# Patient Record
Sex: Female | Born: 1980 | Race: White | Hispanic: No | Marital: Married | Smoking: Never smoker
Health system: Southern US, Community
[De-identification: ages and names within clinical notes are randomized; demographics above are authoritative.]

## PROBLEM LIST (undated history)

## (undated) DIAGNOSIS — I10 Essential (primary) hypertension: Secondary | ICD-10-CM

## (undated) DIAGNOSIS — E039 Hypothyroidism, unspecified: Secondary | ICD-10-CM

## (undated) DIAGNOSIS — K589 Irritable bowel syndrome without diarrhea: Secondary | ICD-10-CM

## (undated) DIAGNOSIS — E063 Autoimmune thyroiditis: Secondary | ICD-10-CM

## (undated) HISTORY — DX: Autoimmune thyroiditis: E06.3

## (undated) HISTORY — DX: Irritable bowel syndrome, unspecified: K58.9

## (undated) HISTORY — DX: Essential (primary) hypertension: I10

## (undated) HISTORY — DX: Hypothyroidism, unspecified: E03.9

---

## 2005-12-25 ENCOUNTER — Ambulatory Visit: Payer: Self-pay | Admitting: Family Medicine

## 2005-12-25 ENCOUNTER — Encounter: Payer: Self-pay | Admitting: Family Medicine

## 2005-12-25 ENCOUNTER — Other Ambulatory Visit: Admission: RE | Admit: 2005-12-25 | Discharge: 2005-12-25 | Payer: Self-pay | Admitting: Family Medicine

## 2005-12-25 LAB — CONVERTED CEMR LAB: Pap Smear: NORMAL

## 2006-09-01 ENCOUNTER — Encounter: Payer: Self-pay | Admitting: Family Medicine

## 2006-09-02 ENCOUNTER — Ambulatory Visit: Payer: Self-pay | Admitting: Family Medicine

## 2006-09-02 DIAGNOSIS — R519 Headache, unspecified: Secondary | ICD-10-CM | POA: Insufficient documentation

## 2006-09-02 DIAGNOSIS — R51 Headache: Secondary | ICD-10-CM | POA: Insufficient documentation

## 2006-09-04 ENCOUNTER — Telehealth (INDEPENDENT_AMBULATORY_CARE_PROVIDER_SITE_OTHER): Payer: Self-pay | Admitting: *Deleted

## 2006-09-04 ENCOUNTER — Telehealth: Payer: Self-pay | Admitting: Family Medicine

## 2006-11-18 ENCOUNTER — Ambulatory Visit: Payer: Self-pay | Admitting: Family Medicine

## 2006-11-18 LAB — CONVERTED CEMR LAB
Blood in Urine, dipstick: NEGATIVE
Glucose, Urine, Semiquant: NEGATIVE
pH: 6

## 2006-11-26 ENCOUNTER — Ambulatory Visit: Payer: Self-pay | Admitting: Family Medicine

## 2006-12-18 ENCOUNTER — Telehealth: Payer: Self-pay | Admitting: Family Medicine

## 2007-01-07 ENCOUNTER — Encounter: Payer: Self-pay | Admitting: Family Medicine

## 2007-01-07 ENCOUNTER — Other Ambulatory Visit: Admission: RE | Admit: 2007-01-07 | Discharge: 2007-01-07 | Payer: Self-pay | Admitting: Family Medicine

## 2007-01-07 ENCOUNTER — Ambulatory Visit: Payer: Self-pay | Admitting: Family Medicine

## 2007-01-07 DIAGNOSIS — J309 Allergic rhinitis, unspecified: Secondary | ICD-10-CM | POA: Insufficient documentation

## 2007-01-08 ENCOUNTER — Telehealth (INDEPENDENT_AMBULATORY_CARE_PROVIDER_SITE_OTHER): Payer: Self-pay | Admitting: *Deleted

## 2007-01-08 ENCOUNTER — Encounter: Payer: Self-pay | Admitting: Family Medicine

## 2007-01-08 LAB — CONVERTED CEMR LAB
Clue Cells Wet Prep HPF POC: NONE SEEN
WBC, Wet Prep HPF POC: NONE SEEN

## 2007-01-14 ENCOUNTER — Telehealth (INDEPENDENT_AMBULATORY_CARE_PROVIDER_SITE_OTHER): Payer: Self-pay | Admitting: *Deleted

## 2007-01-14 ENCOUNTER — Encounter: Payer: Self-pay | Admitting: Family Medicine

## 2007-01-14 LAB — CONVERTED CEMR LAB
AST: 19 units/L (ref 0–37)
Albumin: 4.3 g/dL (ref 3.5–5.2)
Calcium: 9.3 mg/dL (ref 8.4–10.5)
Creatinine, Ser: 0.79 mg/dL (ref 0.40–1.20)
HDL: 64 mg/dL (ref >39)
LDL Cholesterol: 128 mg/dL — ABNORMAL HIGH (ref 0–99)
Sodium: 140 meq/L (ref 135–145)
Total Protein: 7.4 g/dL (ref 6.0–8.3)
Triglycerides: 62 mg/dL (ref <150)
VLDL: 12 mg/dL (ref 0–40)

## 2007-01-16 ENCOUNTER — Encounter: Payer: Self-pay | Admitting: Family Medicine

## 2007-08-10 ENCOUNTER — Ambulatory Visit: Payer: Self-pay | Admitting: Family Medicine

## 2007-08-10 DIAGNOSIS — K589 Irritable bowel syndrome without diarrhea: Secondary | ICD-10-CM | POA: Insufficient documentation

## 2007-08-10 DIAGNOSIS — K219 Gastro-esophageal reflux disease without esophagitis: Secondary | ICD-10-CM | POA: Insufficient documentation

## 2007-11-02 ENCOUNTER — Ambulatory Visit: Payer: Self-pay | Admitting: Obstetrics & Gynecology

## 2007-11-02 ENCOUNTER — Encounter: Payer: Self-pay | Admitting: Family Medicine

## 2007-11-18 ENCOUNTER — Ambulatory Visit: Payer: Self-pay | Admitting: Obstetrics & Gynecology

## 2007-11-18 ENCOUNTER — Encounter: Payer: Self-pay | Admitting: Obstetrics & Gynecology

## 2007-12-15 ENCOUNTER — Ambulatory Visit: Payer: Self-pay | Admitting: Physician Assistant

## 2008-01-13 ENCOUNTER — Ambulatory Visit: Payer: Self-pay | Admitting: Obstetrics & Gynecology

## 2008-01-18 ENCOUNTER — Ambulatory Visit (HOSPITAL_COMMUNITY): Admission: RE | Admit: 2008-01-18 | Discharge: 2008-01-18 | Payer: Self-pay | Admitting: Obstetrics & Gynecology

## 2008-02-10 ENCOUNTER — Ambulatory Visit: Payer: Self-pay | Admitting: Obstetrics & Gynecology

## 2008-03-08 ENCOUNTER — Ambulatory Visit: Payer: Self-pay | Admitting: Obstetrics and Gynecology

## 2008-03-29 ENCOUNTER — Ambulatory Visit: Payer: Self-pay | Admitting: Physician Assistant

## 2008-04-19 ENCOUNTER — Ambulatory Visit: Payer: Self-pay | Admitting: Family

## 2008-04-28 ENCOUNTER — Ambulatory Visit: Payer: Self-pay | Admitting: Obstetrics & Gynecology

## 2008-05-03 ENCOUNTER — Ambulatory Visit: Payer: Self-pay | Admitting: Family

## 2008-05-17 ENCOUNTER — Ambulatory Visit: Payer: Self-pay | Admitting: Obstetrics and Gynecology

## 2008-05-27 ENCOUNTER — Inpatient Hospital Stay (HOSPITAL_COMMUNITY): Admission: AD | Admit: 2008-05-27 | Discharge: 2008-05-27 | Payer: Self-pay | Admitting: Obstetrics & Gynecology

## 2008-05-31 ENCOUNTER — Ambulatory Visit: Payer: Self-pay | Admitting: Obstetrics and Gynecology

## 2008-06-07 ENCOUNTER — Ambulatory Visit: Payer: Self-pay | Admitting: Family

## 2008-06-09 ENCOUNTER — Ambulatory Visit: Payer: Self-pay | Admitting: Obstetrics & Gynecology

## 2008-06-10 ENCOUNTER — Ambulatory Visit: Payer: Self-pay | Admitting: Physician Assistant

## 2008-06-17 ENCOUNTER — Ambulatory Visit: Payer: Self-pay | Admitting: Family

## 2008-06-20 ENCOUNTER — Ambulatory Visit: Payer: Self-pay | Admitting: Obstetrics and Gynecology

## 2008-06-24 ENCOUNTER — Ambulatory Visit: Payer: Self-pay | Admitting: Physician Assistant

## 2008-06-24 ENCOUNTER — Inpatient Hospital Stay (HOSPITAL_COMMUNITY): Admission: AD | Admit: 2008-06-24 | Discharge: 2008-06-24 | Payer: Self-pay | Admitting: Obstetrics & Gynecology

## 2008-06-24 ENCOUNTER — Ambulatory Visit: Payer: Self-pay | Admitting: Family Medicine

## 2008-06-27 ENCOUNTER — Ambulatory Visit: Payer: Self-pay | Admitting: Obstetrics and Gynecology

## 2008-07-01 ENCOUNTER — Ambulatory Visit: Payer: Self-pay | Admitting: Gynecology

## 2008-07-01 ENCOUNTER — Inpatient Hospital Stay (HOSPITAL_COMMUNITY): Admission: RE | Admit: 2008-07-01 | Discharge: 2008-07-05 | Payer: Self-pay | Admitting: Obstetrics & Gynecology

## 2008-07-18 ENCOUNTER — Ambulatory Visit: Payer: Self-pay | Admitting: Obstetrics and Gynecology

## 2008-07-26 ENCOUNTER — Ambulatory Visit: Payer: Self-pay | Admitting: Family Medicine

## 2008-08-02 ENCOUNTER — Ambulatory Visit: Payer: Self-pay | Admitting: Family Medicine

## 2008-08-08 ENCOUNTER — Ambulatory Visit: Payer: Self-pay | Admitting: Obstetrics and Gynecology

## 2008-08-08 ENCOUNTER — Encounter: Payer: Self-pay | Admitting: Obstetrics and Gynecology

## 2008-08-08 ENCOUNTER — Inpatient Hospital Stay (HOSPITAL_COMMUNITY): Admission: AD | Admit: 2008-08-08 | Discharge: 2008-08-08 | Payer: Self-pay | Admitting: Family Medicine

## 2008-08-26 ENCOUNTER — Ambulatory Visit: Payer: Self-pay | Admitting: Physician Assistant

## 2008-11-25 ENCOUNTER — Encounter: Payer: Self-pay | Admitting: Family

## 2008-11-25 ENCOUNTER — Ambulatory Visit: Payer: Self-pay | Admitting: Family

## 2008-12-07 ENCOUNTER — Ambulatory Visit: Payer: Self-pay | Admitting: Obstetrics & Gynecology

## 2009-06-14 ENCOUNTER — Ambulatory Visit: Payer: Self-pay | Admitting: Obstetrics & Gynecology

## 2009-06-14 ENCOUNTER — Encounter: Payer: Self-pay | Admitting: Obstetrics & Gynecology

## 2009-09-15 ENCOUNTER — Encounter: Payer: Self-pay | Admitting: Family

## 2009-09-15 ENCOUNTER — Ambulatory Visit: Payer: Self-pay | Admitting: Obstetrics and Gynecology

## 2009-09-15 LAB — CONVERTED CEMR LAB
HCT: 41.8 % (ref 36.0–46.0)
Lymphocytes Relative: 28 % (ref 12–46)
MCHC: 32.5 g/dL (ref 30.0–36.0)
MCV: 88.9 fL (ref 78.0–100.0)
Monocytes Relative: 8 % (ref 3–12)
Neutrophils Relative %: 62 % (ref 43–77)
Platelets: 303 10*3/uL (ref 150–400)
Rh Type: POSITIVE
Rubella: 52.2 intl units/mL — ABNORMAL HIGH

## 2009-09-16 ENCOUNTER — Encounter: Payer: Self-pay | Admitting: Family

## 2009-10-16 ENCOUNTER — Ambulatory Visit: Payer: Self-pay | Admitting: Family

## 2009-10-17 ENCOUNTER — Ambulatory Visit (HOSPITAL_COMMUNITY): Admission: RE | Admit: 2009-10-17 | Discharge: 2009-10-17 | Payer: Self-pay | Admitting: Obstetrics & Gynecology

## 2009-11-13 ENCOUNTER — Ambulatory Visit: Payer: Self-pay | Admitting: Family

## 2009-12-05 ENCOUNTER — Ambulatory Visit (HOSPITAL_COMMUNITY): Admission: RE | Admit: 2009-12-05 | Discharge: 2009-12-05 | Payer: Self-pay | Admitting: Obstetrics & Gynecology

## 2009-12-11 ENCOUNTER — Ambulatory Visit: Payer: Self-pay | Admitting: Family

## 2010-01-08 ENCOUNTER — Ambulatory Visit: Payer: Self-pay | Admitting: Obstetrics and Gynecology

## 2010-02-12 ENCOUNTER — Ambulatory Visit: Payer: Self-pay | Admitting: Family

## 2010-02-13 ENCOUNTER — Encounter: Payer: Self-pay | Admitting: Family

## 2010-02-13 LAB — CONVERTED CEMR LAB
Platelets: 241 10*3/uL (ref 150–400)
RBC: 3.88 M/uL (ref 3.87–5.11)
WBC: 7.9 10*3/uL (ref 4.0–10.5)

## 2010-02-20 ENCOUNTER — Encounter: Payer: Self-pay | Admitting: Obstetrics & Gynecology

## 2010-03-06 ENCOUNTER — Ambulatory Visit: Payer: Self-pay | Admitting: Obstetrics & Gynecology

## 2010-03-13 ENCOUNTER — Ambulatory Visit: Payer: Self-pay | Admitting: Family Medicine

## 2010-03-13 DIAGNOSIS — IMO0002 Reserved for concepts with insufficient information to code with codable children: Secondary | ICD-10-CM | POA: Insufficient documentation

## 2010-03-13 LAB — CONVERTED CEMR LAB
Bilirubin Urine: NEGATIVE
Blood in Urine, dipstick: NEGATIVE
Ketones, urine, test strip: NEGATIVE
Nitrite: NEGATIVE
Protein, U semiquant: NEGATIVE
Urobilinogen, UA: 0.2

## 2010-03-14 ENCOUNTER — Ambulatory Visit (HOSPITAL_COMMUNITY): Admission: RE | Admit: 2010-03-14 | Discharge: 2010-03-14 | Payer: Self-pay | Admitting: Family Medicine

## 2010-03-19 ENCOUNTER — Ambulatory Visit: Payer: Self-pay | Admitting: Obstetrics and Gynecology

## 2010-03-23 ENCOUNTER — Encounter: Payer: Self-pay | Admitting: Obstetrics and Gynecology

## 2010-03-23 LAB — CONVERTED CEMR LAB
ALT: 15 units/L (ref 0–35)
Albumin: 3.6 g/dL (ref 3.5–5.2)
Alkaline Phosphatase: 90 units/L (ref 39–117)
BUN: 6 mg/dL (ref 6–23)
Chloride: 104 meq/L (ref 96–112)
Creatinine 24 HR UR: 1485 mg/24hr (ref 700–1800)
Creatinine Clearance: 206 mL/min — ABNORMAL HIGH (ref 75–115)
Creatinine, Ser: 0.5 mg/dL (ref 0.40–1.20)
Creatinine, Urine: 74.3 mg/dL
Glucose, Bld: 84 mg/dL (ref 70–99)
Hemoglobin: 12.4 g/dL (ref 12.0–15.0)
MCHC: 32.6 g/dL (ref 30.0–36.0)
Potassium: 4 meq/L (ref 3.5–5.3)
RBC: 4.03 M/uL (ref 3.87–5.11)
Total Bilirubin: 0.4 mg/dL (ref 0.3–1.2)
Total Protein: 6.3 g/dL (ref 6.0–8.3)

## 2010-03-26 ENCOUNTER — Ambulatory Visit: Payer: Self-pay | Admitting: Family

## 2010-04-02 ENCOUNTER — Ambulatory Visit: Payer: Self-pay | Admitting: Advanced Practice Midwife

## 2010-04-10 ENCOUNTER — Ambulatory Visit: Payer: Self-pay | Admitting: Obstetrics & Gynecology

## 2010-04-11 ENCOUNTER — Encounter: Payer: Self-pay | Admitting: Obstetrics & Gynecology

## 2010-04-11 LAB — CONVERTED CEMR LAB
Chlamydia, DNA Probe: NEGATIVE
Clue Cells Wet Prep HPF POC: NONE SEEN
GC Probe Amp, Genital: NEGATIVE
Trich, Wet Prep: NONE SEEN

## 2010-04-16 ENCOUNTER — Ambulatory Visit: Payer: Self-pay | Admitting: Obstetrics and Gynecology

## 2010-04-17 ENCOUNTER — Ambulatory Visit (HOSPITAL_COMMUNITY): Admission: RE | Admit: 2010-04-17 | Discharge: 2010-04-17 | Payer: Self-pay | Admitting: Family Medicine

## 2010-04-24 ENCOUNTER — Ambulatory Visit: Payer: Self-pay | Admitting: Family Medicine

## 2010-04-30 ENCOUNTER — Ambulatory Visit: Payer: Self-pay | Admitting: Obstetrics and Gynecology

## 2010-05-08 ENCOUNTER — Ambulatory Visit: Payer: Self-pay | Admitting: Obstetrics & Gynecology

## 2010-05-11 ENCOUNTER — Ambulatory Visit: Payer: Self-pay | Admitting: Advanced Practice Midwife

## 2010-05-15 ENCOUNTER — Ambulatory Visit: Payer: Self-pay | Admitting: Obstetrics & Gynecology

## 2010-05-17 ENCOUNTER — Ambulatory Visit (HOSPITAL_COMMUNITY): Admission: RE | Admit: 2010-05-17 | Discharge: 2010-05-17 | Payer: Self-pay | Admitting: Obstetrics & Gynecology

## 2010-05-18 ENCOUNTER — Ambulatory Visit: Payer: Self-pay | Admitting: Physician Assistant

## 2010-05-21 ENCOUNTER — Ambulatory Visit (HOSPITAL_COMMUNITY): Admission: RE | Admit: 2010-05-21 | Discharge: 2010-05-21 | Payer: Self-pay | Admitting: Obstetrics & Gynecology

## 2010-05-21 ENCOUNTER — Ambulatory Visit: Payer: Self-pay | Admitting: Physician Assistant

## 2010-05-22 ENCOUNTER — Ambulatory Visit: Payer: Self-pay | Admitting: Family Medicine

## 2010-05-22 ENCOUNTER — Inpatient Hospital Stay (HOSPITAL_COMMUNITY): Admission: RE | Admit: 2010-05-22 | Discharge: 2010-05-24 | Payer: Self-pay | Admitting: Family Medicine

## 2010-05-28 ENCOUNTER — Ambulatory Visit: Payer: Self-pay | Admitting: Physician Assistant

## 2010-07-06 ENCOUNTER — Ambulatory Visit: Payer: Self-pay | Admitting: Family

## 2010-09-05 ENCOUNTER — Ambulatory Visit: Payer: Self-pay | Admitting: Obstetrics & Gynecology

## 2010-09-05 LAB — CONVERTED CEMR LAB

## 2010-10-11 ENCOUNTER — Encounter
Admission: RE | Admit: 2010-10-11 | Discharge: 2010-10-11 | Payer: Self-pay | Source: Home / Self Care | Attending: Obstetrics & Gynecology | Admitting: Obstetrics & Gynecology

## 2010-10-12 ENCOUNTER — Ambulatory Visit
Admission: RE | Admit: 2010-10-12 | Discharge: 2010-10-12 | Payer: Self-pay | Source: Home / Self Care | Attending: Physician Assistant | Admitting: Physician Assistant

## 2010-10-13 ENCOUNTER — Encounter: Payer: Self-pay | Admitting: Physician Assistant

## 2010-10-28 ENCOUNTER — Encounter: Payer: Self-pay | Admitting: Obstetrics & Gynecology

## 2010-11-06 NOTE — Assessment & Plan Note (Signed)
Summary: sinusitis/ BP   Vital Signs:  Patient profile:   30 year old female Height:      67 inches Weight:      165 pounds BMI:     25.94 O2 Sat:      97 % on Room air Pulse rate:   120 / minute BP sitting:   135 / 93  (left arm) Cuff size:   large  Vitals Entered By: Payton Spark CMA (March 13, 2010 9:10 AM)  O2 Flow:  Room air CC:  ~[redacted] wks pregnant-Allergies/ sinusitis causing blurriness in L eye.  Vision Screening:Left eye with correction: 20 / 200 Right eye with correction: 20 / 25 Both eyes with correction: 20 / 25        Vision Entered By: Payton Spark CMA (March 13, 2010 9:11 AM)   Primary Care Provider:  [name]  CC:   ~[redacted] wks pregnant-Allergies/ sinusitis causing blurriness in L eye.Marland Kitchen  History of Present Illness: 30 yo WF presents for problems with her allergies.  She is [redacted] wks pregnant, seeing OB down the hall.  She is having some blurring from her L eye.  She was having HAs last wk but they have improved.  Her OB has been watching her BPs.  She went to the eye doctor in Dec.  She wears contacts.  She is not having any itchy or watering from her eye.  She has not been taking anything for allergies other than nasal spray.  Her next OB appt is Monday.  She has hx of allergies and sinusitis in the past.    Current Medications (verified): 1)  None  Allergies (verified): No Known Drug Allergies  Past History:  Past Medical History: G2P1- [facility]  Social History: Reviewed history from 01/07/2007 and no changes required. 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  Nonsmoker.  Runs 5 days a wk.  Has her masters.  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  Review of Systems      See HPI  Physical Exam  General:  alert, well-developed, well-nourished, and well-hydrated.  pregnant Head:  normocephalic and atraumatic.   Eyes:  mild ptosis of the L upper eyelid w/o edema Nose:  scant clear  rhinorrhea Mouth:  o/p pink and moist w/ mild injection Neck:  no masses.   Lungs:  Normal respiratory effort, chest expands symmetrically. Lungs are clear to auscultation, no crackles or wheezes. Heart:  regular rhythm and tachycardia.   Extremities:  no LE edema Neurologic:  no tremor Skin:  color normal.   Cervical Nodes:  No lymphadenopathy noted Psych:  good eye contact, not anxious appearing, and not depressed appearing.     Impression & Recommendations:  Problem # 1:  PREGNANCY-INDUCED HYPERTENSION (ICD-642.90)  DBP is >90 today. UA done to look for protein.  NEGATIVE. No leg edema or scotomata.    I dd contact her OB down the hall to let them know of her BP.    Orders: UA Dipstick w/o Micro (automated)  (81003)  Problem # 2:  ALLERGIC RHINITIS (ICD-477.9) Hx of allergies/ seasonal sinus infections.  Pt is pregnant so has sub- obtimally been treating her allergies this year.  Due to pressure behind her L eye, rhinorrhea, HAs and post nasal drip, will go ahead and treat for sinusitis and add Vermayst nasal spray for the next 2-3 wks.  Call if not improving. The following medications were removed  from the medication list:    Flonase 50 Mcg/act Susp (Fluticasone propionate) .Marland Kitchen... 2 sprays per nostril daily  Complete Medication List: 1)  Zithromax 250 Mg Tabs (Azithromycin) .... 2 tabs by mouth x 1 day then 1 tab by mouth daily x 4 days  Patient Instructions: 1)  Urine NEG for protein. 2)  Will start you on zithromax to cover for possible sinusitis and add Veramyst 2 sprays/ nostril day.   3)  I contacted your OB office about your BP. Prescriptions: ZITHROMAX 250 MG TABS (AZITHROMYCIN) 2 tabs by mouth x 1 day then 1 tab by mouth daily x 4 days  #6 tabs x 0   Entered and Authorized by:   [name]   Signed by:   [name] on 03/13/2010   Method used:   Electronically to        [facility] [phone]* (retail)       [address]       [place], [place]   [address]       Ph: [phone]       Fax: [phone]   RxID:   [id number]   Laboratory Results   Urine Tests    Routine Urinalysis   Color: yellow Appearance: Clear Glucose: negative   (Normal Range: Negative) Bilirubin: negative   (Normal Range: Negative) Ketone: negative   (Normal Range: Negative) Spec. Gravity: 1.015   (Normal Range: 1.003-1.035) Blood: negative   (Normal Range: Negative) pH: 7.0   (Normal Range: 5.0-8.0) Protein: negative   (Normal Range: Negative) Urobilinogen: 0.2   (Normal Range: 0-1) Nitrite: negative   (Normal Range: Negative) Leukocyte Esterace: negative   (Normal Range: Negative)

## 2010-11-08 ENCOUNTER — Ambulatory Visit (INDEPENDENT_AMBULATORY_CARE_PROVIDER_SITE_OTHER): Payer: BC Managed Care – PPO | Admitting: Family Medicine

## 2010-11-08 ENCOUNTER — Encounter: Payer: Self-pay | Admitting: Family Medicine

## 2010-11-08 DIAGNOSIS — K5900 Constipation, unspecified: Secondary | ICD-10-CM

## 2010-11-14 NOTE — Assessment & Plan Note (Signed)
Summary: constipation   Vital Signs:  Patient profile:   30 year old female Height:      67 inches Weight:      146 pounds BMI:     22.95 O2 Sat:      99 % on Room air Temp:     97.9 degrees F oral Pulse rate:   116 / minute BP sitting:   129 / 97  (left arm) Cuff size:   regular  Vitals Entered By: Jade Dixon CMA (November 08, 2010 9:17 AM)  O2 Flow:  Room air CC: Bloating x 2 months.   Primary Care Provider:  [name]  CC:  Bloating x 2 months..  History of Present Illness: 30 yo WF presents for abdominal bloating that seemed to starts 3 months ago.  She saw Dr Jade Dixon for vaginal dryness and was given Vagifem which she thought was the cause of the bloating but she stopped it and it did not improve.  She was then u/s for pelvic pain but it was negative for an ovarian cyst.  She has been a little more constipated this past month.  No blood in her stool.  She notices it more later in the day and has more gas.  She is not cramping or having any diarrhea.    She is breastfeeding.    Current Medications (verified): 1)  Prenatal Vitamins 0.8 Mg Tabs (Prenatal Multivit-Min-Fe-Fa)  Allergies (verified): No Known Drug Allergies  Past History:  Past Medical History: G2P2- [facility]  Social History: Reviewed history from 01/07/2007 and no changes required. 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  Nonsmoker.  Runs 5 days a wk.  Has her masters.  Married to Jade Dixon, plays pro baseball.  Review of Systems      See HPI  Physical Exam  General:  alert, well-developed, well-nourished, and well-hydrated.   Head:  normocephalic and atraumatic.   Eyes:  sclera non icteric Mouth:  good dentition and pharynx pink and moist.   Neck:  no masses and no thyromegaly.   Lungs:  Normal respiratory effort, chest expands symmetrically. Lungs are clear to auscultation, no crackles or wheezes. Heart:  Normal rate and regular  rhythm. S1 and S2 normal without gallop, murmur, click, rub or other extra sounds. Abdomen:  soft, non-tender, normal bowel sounds, no distention, no masses, no guarding, no hepatomegaly, and no splenomegaly.   Extremities:  no LE edema Skin:  color normal.   Psych:  good eye contact, not anxious appearing, and not depressed appearing.     Impression & Recommendations:  Problem # 1:  CONSTIPATION (ICD-564.00) Constipation likely the cause for her intermittent abd bloating and vague discomfort x months in the abscence of cramping, diarrhea or nausea.  Explained that while breastfeeding, it is easy to get dehydrated if not drinking enough fluids.  Now that she is back to work, she admits to not drinking enough and not exercising.  She does eat fairly healthy.  She already had a normal pelvic u/s with gyn.  Info given on tx of constipation.  Will increase water intake, increase fiber intake and add a stool softener with short term use of Miralax.    Expect to see improvements in the next 2-3 wks.  If not improving, pls call.  Complete Medication List: 1)  Prenatal Vitamins 0.8 Mg Tabs (Prenatal multivit-min-fe-fa)  Patient Instructions: 1)  Abdominal bloating likely secondary to constipation. 2)  Read thru article. 3)  Work on high [employer] - lots of fruit and veggies, increased water intake.  Add stool softener like Ducolax daily and take Miralax once daily until constipation improves. 4)  Call if not improving after 3-4 wks.   Orders Added: 1)  Est. Patient Level III [91478]

## 2010-12-21 LAB — HEMOGLOBIN AND HEMATOCRIT, BLOOD: Hemoglobin: 11.7 g/dL — ABNORMAL LOW (ref 12.0–15.0)

## 2010-12-21 LAB — RPR: RPR Ser Ql: NONREACTIVE

## 2010-12-21 LAB — CBC
Hemoglobin: 10.4 g/dL — ABNORMAL LOW (ref 12.0–15.0)
MCH: 32.7 pg (ref 26.0–34.0)
MCH: 33.3 pg (ref 26.0–34.0)
MCHC: 35 g/dL (ref 30.0–36.0)
MCV: 95.2 fL (ref 78.0–100.0)
Platelets: 129 10*3/uL — ABNORMAL LOW (ref 150–400)
Platelets: 186 10*3/uL (ref 150–400)
RBC: 3.12 MIL/uL — ABNORMAL LOW (ref 3.87–5.11)

## 2010-12-21 LAB — CROSSMATCH
ABO/RH(D): A POS
Antibody Screen: NEGATIVE

## 2010-12-21 LAB — ABO/RH: ABO/RH(D): A POS

## 2010-12-21 LAB — SURGICAL PCR SCREEN: MRSA, PCR: NEGATIVE

## 2011-01-07 ENCOUNTER — Encounter: Payer: Self-pay | Admitting: Family Medicine

## 2011-01-10 ENCOUNTER — Encounter: Payer: Self-pay | Admitting: Family Medicine

## 2011-01-10 ENCOUNTER — Ambulatory Visit (INDEPENDENT_AMBULATORY_CARE_PROVIDER_SITE_OTHER): Payer: BC Managed Care – PPO | Admitting: Family Medicine

## 2011-01-10 VITALS — BP 134/86 | HR 101 | Ht 67.5 in | Wt 141.0 lb

## 2011-01-10 DIAGNOSIS — F411 Generalized anxiety disorder: Secondary | ICD-10-CM

## 2011-01-10 DIAGNOSIS — L299 Pruritus, unspecified: Secondary | ICD-10-CM

## 2011-01-10 DIAGNOSIS — R5381 Other malaise: Secondary | ICD-10-CM

## 2011-01-10 DIAGNOSIS — F419 Anxiety disorder, unspecified: Secondary | ICD-10-CM

## 2011-01-10 DIAGNOSIS — R5383 Other fatigue: Secondary | ICD-10-CM

## 2011-01-10 MED ORDER — HYDROXYZINE HCL 50 MG PO TABS: ORAL_TABLET | ORAL | Status: DC

## 2011-01-10 NOTE — Patient Instructions (Signed)
Labs downstairs today. Will call you w/ results tomorrow.  Start Hydroxyzine at night (1/2 to a full tab) for both itching and anxiety. OK to contintue Claritin in the AM if needed.  Use OTC Sarna Lotion for itching(this is not a steroid).  F/U with derm for mole.  Return for f/u anxiety in 6 wks.

## 2011-01-10 NOTE — Progress Notes (Signed)
  Subjective:    Patient ID: Jade Dixon, female    DOB: 1980/10/27, 30 y.o.   MRN: [id number]  HPI 30 yo WF presents for feeling itchy x 1-2 wks.  She feels nervous all the time and anxious.  She went back to work after having her 2nd child and is juggling work, the kids and has little support other than her husband.  She is having a hard time sleeping at night.  Her youngest is 8 mos and still breastfeeding.  Christain has never had depression issues nor needed meds for anxiety in the past.    She has not seen a rash but is paranoid that 'something is wrong with her'.  She has noticed itchy scalpy and hair loss.  Has some mild fatigue.  Eats healthy and exercises regularly.  She denies hx of eczema, recent ilness and has not changed any environmental factors in her daily routine.  BP 134/86  Pulse 101  Ht 5' 7.5" (1.715 m)  Wt 141 lb (63.957 kg)  BMI 21.76 kg/m2  SpO2 100%     Review of Systems     Objective:   Physical Exam  Constitutional: She appears well-developed and well-nourished. No distress.  HENT:  Head: Normocephalic and atraumatic.  Mouth/Throat: No oropharyngeal exudate.       Slight hair thinning over crown  Eyes: Conjunctivae are normal. Pupils are equal, round, and reactive to light. No scleral icterus.  Neck: Thyromegaly (slightly enlarjged, nontender thyroid w/o palpable masses) present.  Cardiovascular: Normal rate, regular rhythm and normal heart sounds.   Pulmonary/Chest: Effort normal and breath sounds normal. She has no wheezes.  Abdominal: Soft. Bowel sounds are normal.       No HSM  Musculoskeletal: She exhibits no edema.  Lymphadenopathy:    She has no cervical adenopathy.  Skin: Skin is warm and dry. No rash noted. No erythema.  Psychiatric: Her mood appears anxious.          Assessment & Plan:

## 2011-01-14 ENCOUNTER — Other Ambulatory Visit: Payer: Self-pay | Admitting: Family Medicine

## 2011-01-14 DIAGNOSIS — L299 Pruritus, unspecified: Secondary | ICD-10-CM | POA: Insufficient documentation

## 2011-01-14 DIAGNOSIS — F419 Anxiety disorder, unspecified: Secondary | ICD-10-CM | POA: Insufficient documentation

## 2011-01-14 NOTE — Assessment & Plan Note (Signed)
Pt admits to feeling anxious which is perpetuating her pruritis.  Will r/o other causes to ease her mind.  She would like to start counseling and is opposed to meds now that she is breastfeeding.  We discussed stress reduction as part of her treatment plan.  Will see her back in 2 mos for f/u.

## 2011-01-14 NOTE — Assessment & Plan Note (Signed)
Will r/o thyroid d/o (for dry itchy skin) and elevated liver enzymes but w/o appreciable rash on exam, pruritis is likely from anxiety.  She can use SARNA lotion OTC and Hydroxyzine at night.  Avoid scratching.  Oatmeals baths can help and will address her underlying anxiety.

## 2011-01-15 ENCOUNTER — Ambulatory Visit (INDEPENDENT_AMBULATORY_CARE_PROVIDER_SITE_OTHER): Payer: BC Managed Care – PPO | Admitting: Obstetrics & Gynecology

## 2011-01-15 DIAGNOSIS — N760 Acute vaginitis: Secondary | ICD-10-CM

## 2011-01-15 LAB — CBC WITH DIFFERENTIAL/PLATELET
Basophils Absolute: 0 10*3/uL (ref 0.0–0.1)
Basophils Relative: 1 % (ref 0–1)
Eosinophils Absolute: 0.1 10*3/uL (ref 0.0–0.7)
Eosinophils Relative: 2 % (ref 0–5)
HCT: 41.5 % (ref 36.0–46.0)
Lymphocytes Relative: 52 % — ABNORMAL HIGH (ref 12–46)
MCHC: 32.8 g/dL (ref 30.0–36.0)
MCV: 90.4 fL (ref 78.0–100.0)
Monocytes Absolute: 0.4 10*3/uL (ref 0.1–1.0)
Platelets: 248 10*3/uL (ref 150–400)
RDW: 13.2 % (ref 11.5–15.5)
WBC: 6 10*3/uL (ref 4.0–10.5)

## 2011-01-15 LAB — TSH: TSH: 5.58 u[IU]/mL — ABNORMAL HIGH (ref 0.350–4.500)

## 2011-01-15 LAB — COMPLETE METABOLIC PANEL WITH GFR
ALT: 12 U/L (ref 0–35)
AST: 16 U/L (ref 0–37)
Alkaline Phosphatase: 73 U/L (ref 39–117)
BUN: 13 mg/dL (ref 6–23)
Calcium: 9.4 mg/dL (ref 8.4–10.5)
Creat: 0.74 mg/dL (ref 0.40–1.20)
Total Bilirubin: 0.7 mg/dL (ref 0.3–1.2)

## 2011-01-16 ENCOUNTER — Telehealth: Payer: Self-pay | Admitting: Family Medicine

## 2011-01-16 NOTE — Telephone Encounter (Signed)
[name], pls add a free T3 and free T4. Dx: abnormal TSH.

## 2011-01-17 ENCOUNTER — Telehealth: Payer: Self-pay | Admitting: Family Medicine

## 2011-01-17 LAB — T4, FREE: Free T4: 1.1 ng/dL (ref 0.80–1.80)

## 2011-01-17 LAB — T3, FREE: T3, Free: 2.8 pg/mL (ref 2.3–4.2)

## 2011-01-17 NOTE — Assessment & Plan Note (Signed)
NAME:  Jade Dixon, Jade Dixon NO.:  [id number]  MEDICAL RECORD NO.:  [id number]           PATIENT TYPE:  LOCATION:  [facility] [place]           FACILITY:  PHYSICIAN:  Elsie Lincoln, MD      DATE OF BIRTH:  01-15-1981  DATE OF SERVICE:  01/15/2011                                 CLINIC NOTE  The patient is a 30 year old female who presents for followup of atrophic vaginitis and pain in her flank area.  The patient is very up front today and admits that she is having some anxiety about dying and leaving her two children alone.  She thinks that she is having  major medical problems and Dr. Cathey Endow has placed her on something for anxiety, but she is not taking it because she thinks this is incompatible with breast feeding.  The patient stopped taking the Vagifem because she was worried that this is going to be causing problems with her health even though she said it made sex more comfortable and made the feeling of "bubbles" from the vagina.  She said the sexual intercourse is getting better even without the Vagifem.  She has had a recent transvaginal ultrasound back early this year which showed a thin endometrium, a normal uterus, and normal right and left ovaries.  She is amenorrheic with breast-feeding.  Her urine pregnancy test is negative and her UA is negative today.  PHYSICAL EXAMINATION:  VITAL SIGNS:  Pulse 105, blood pressure 153/93, weight 139, height 67 inches. GENERAL:  Appears anxious, well developed. ABDOMEN:  Soft, nontender.  No organomegaly.  No hernia.  Incision well healed.  No rebound or guarding. GENITALIA:  Tanner 5.  Vagina is pale, pink, and slightly atrophic. Cervix closed, nontender.  Uterus anteverted, nontender.  Adnexa, no masses, nontender.  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 1. Follow up with primary care provider for these issues. 2. The patient was  reassured that there is no gynecological issue     present.  The patient can continue Vagifem if she wishes, but does     not need to always.  Atrophic vaginitis is very mild and is not     going to cause any long-term health concerns and will go away when     she stops breast-feeding. 3. The patient is to come back if periods is not resumed after she     stops breast-feeding. 4. All questions were answered, and the patient felt satisfied with     her exam today.          ______________________________ Elsie Lincoln, MD    KL/MEDQ  D:  01/15/2011  T:  01/16/2011  Job:  [id number]

## 2011-01-17 NOTE — Telephone Encounter (Signed)
Pt aware of the above  

## 2011-01-17 NOTE — Telephone Encounter (Signed)
Pls call pt.  Let her know that her blood counts came back normal.  Her fasting sugar, liver and kidney function are normal.  Her TSH came back abnormal Jade Dixon- I added on a free T3 and Free T4 but I don't see them as pending -- pls make sure the lab added these.  ).  I am waiting further results to confirm that you are HYPOthyroid.  I should have an answer today or tomorrow.

## 2011-01-18 ENCOUNTER — Telehealth: Payer: Self-pay | Admitting: Family Medicine

## 2011-01-18 NOTE — Telephone Encounter (Signed)
I called and LMOM to have pt call back.  She has subclinical hypothyroidism and I would suggest a trial of low dose synthroid since she is symptomatic (fatigue, depressed mood, hair loss).  If she is interested, I will send RX for synthroid 25 mcg daily (ok to continue breastfeeding with ) to her pharmacy and ask to see her back and recheck her TSH after 3 mos.

## 2011-01-21 MED ORDER — LEVOTHYROXINE SODIUM 25 MCG PO TABS: 25.0000 ug | ORAL_TABLET | Freq: Every day | ORAL | Status: DC

## 2011-01-21 NOTE — Telephone Encounter (Signed)
Pt would like to start med and she will f/u in 3 months.

## 2011-01-30 ENCOUNTER — Ambulatory Visit (INDEPENDENT_AMBULATORY_CARE_PROVIDER_SITE_OTHER): Payer: BC Managed Care – PPO | Admitting: Psychology

## 2011-01-30 DIAGNOSIS — F411 Generalized anxiety disorder: Secondary | ICD-10-CM

## 2011-02-01 ENCOUNTER — Ambulatory Visit
Admission: RE | Admit: 2011-02-01 | Discharge: 2011-02-01 | Disposition: A | Discharge status: Home / Self Care | Payer: BC Managed Care – PPO | Source: Ambulatory Visit | Attending: Family Medicine | Admitting: Family Medicine

## 2011-02-01 ENCOUNTER — Encounter: Payer: Self-pay | Admitting: Family Medicine

## 2011-02-01 ENCOUNTER — Ambulatory Visit (INDEPENDENT_AMBULATORY_CARE_PROVIDER_SITE_OTHER): Payer: BC Managed Care – PPO | Admitting: Family Medicine

## 2011-02-01 DIAGNOSIS — E039 Hypothyroidism, unspecified: Secondary | ICD-10-CM

## 2011-02-01 DIAGNOSIS — R109 Unspecified abdominal pain: Secondary | ICD-10-CM

## 2011-02-01 DIAGNOSIS — K589 Irritable bowel syndrome without diarrhea: Secondary | ICD-10-CM

## 2011-02-01 DIAGNOSIS — F411 Generalized anxiety disorder: Secondary | ICD-10-CM

## 2011-02-01 DIAGNOSIS — F419 Anxiety disorder, unspecified: Secondary | ICD-10-CM

## 2011-02-01 DIAGNOSIS — E038 Other specified hypothyroidism: Secondary | ICD-10-CM

## 2011-02-01 MED ORDER — IOHEXOL 300 MG/ML  SOLN
100.0000 mL | Freq: Once | INTRAMUSCULAR | Status: AC | PRN
  Administered 2011-02-01: 100 mL via INTRAVENOUS

## 2011-02-01 NOTE — Patient Instructions (Signed)
CT Abdomen/ Pelvis downstairs today.  Will call you w/ results  Tomorrow.  Stay on high fiber diet, lots of water, miralax prn.  Use Tucks Pads and Preparation H prn for hemorrhoids.  Will add referral to [facility] for GI follow up.  Return for f/u mood, thyroid in 6 wks.

## 2011-02-01 NOTE — Progress Notes (Signed)
  Subjective:    Patient ID: Jade Dixon, female    DOB: 04/28/1981, 30 y.o.   MRN: [id number]  [facility] yo WF presents for abdominal pain that has started 6 mos ago.  She was bloating and having pain with intercourse.  Saw Dr Penne Lash for 'air bubbles' in the vagina.  She did have a C section about 8 mos ago.  She took vagifem but is off of it now.  Her bloating increased when she was on it.  She had a pelvic u/s which came back neg.  She tried high fiber diet, miralax which helped some but she still has some pain.  She was having itching all over her body last visit with alternating diarrhea and constipation.  She has had hemorrhoids since the birth of her first daughter.    BP 142/92  Pulse 123  Ht 5' 7.5" (1.715 m)  Wt 141 lb (63.957 kg)  BMI 21.76 kg/m2  SpO2 100%  Patient Active Problem List  Diagnoses  . ALLERGIC RHINITIS  . GERD  . IBS  . PREGNANCY-INDUCED HYPERTENSION  . FACIAL PAIN  . CONSTIPATION  . Anxiety  . Itching       Review of Systems  Constitutional: Negative for fatigue and unexpected weight change.  Respiratory: Negative for shortness of breath.   Cardiovascular: Negative for chest pain, palpitations and leg swelling.  Gastrointestinal: Positive for nausea, abdominal pain, constipation and abdominal distention. Negative for vomiting, diarrhea and blood in stool.  Genitourinary: Negative for dysuria.  Musculoskeletal: Negative for arthralgias.  Neurological: Negative for headaches.  Psychiatric/Behavioral: Positive for dysphoric mood. Negative for suicidal ideas and sleep disturbance. The patient is nervous/anxious.        Objective:   Physical Exam  Constitutional: She appears well-developed and well-nourished. No distress.  HENT:  Head: Normocephalic and atraumatic.  Eyes: Conjunctivae are normal. No scleral icterus.  Neck: Neck supple. No thyromegaly present.  Cardiovascular: Normal rate, regular rhythm and normal heart sounds.   Pulmonary/Chest:  Effort normal and breath sounds normal.  Abdominal: Soft. Bowel sounds are normal. She exhibits no distension and no mass. There is tenderness (periumbilical tenderness). There is guarding.  Skin: Skin is warm and dry. No pallor.  Psychiatric: She has a normal mood and affect.          Assessment & Plan:

## 2011-02-02 DIAGNOSIS — E039 Hypothyroidism, unspecified: Secondary | ICD-10-CM | POA: Insufficient documentation

## 2011-02-02 DIAGNOSIS — E038 Other specified hypothyroidism: Secondary | ICD-10-CM | POA: Insufficient documentation

## 2011-02-02 DIAGNOSIS — R109 Unspecified abdominal pain: Secondary | ICD-10-CM | POA: Insufficient documentation

## 2011-02-02 DIAGNOSIS — K589 Irritable bowel syndrome without diarrhea: Secondary | ICD-10-CM | POA: Insufficient documentation

## 2011-02-02 NOTE — Assessment & Plan Note (Signed)
Anxiety still definitely a component to her syptoms, now with elevated BP.  Itching has improved on hydroxyzine but she continues to have hair loss, IBS,e tc.  Still opposed to meds but a counseling referral was made.

## 2011-02-02 NOTE — Assessment & Plan Note (Signed)
Abd pain likely due to IBS - constipation given recent stressors and constellation of symptoms related to anxiety.  Since this all started after her LTCS, concern for adhesions, fistula, etc.  Will proceed with CT abd/ pelvis with contrast now.  Plan to refer to GI if not resolving esp given that her dietary changes have not helped too much.

## 2011-02-02 NOTE — Assessment & Plan Note (Signed)
TSH high but Free T3 and free T4 normal but since she was having fatigue and hair loss, she started synthroid 2 wks ago.  Not yet seeing a benefit but will give her 6 more wks on meds and will recheck TSH.

## 2011-02-03 ENCOUNTER — Encounter: Payer: Self-pay | Admitting: Family Medicine

## 2011-02-03 ENCOUNTER — Inpatient Hospital Stay (INDEPENDENT_AMBULATORY_CARE_PROVIDER_SITE_OTHER)
Admission: RE | Admit: 2011-02-03 | Discharge: 2011-02-03 | Disposition: A | Discharge status: Home / Self Care | Payer: BC Managed Care – PPO | Source: Ambulatory Visit | Attending: Family Medicine | Admitting: Family Medicine

## 2011-02-03 DIAGNOSIS — M533 Sacrococcygeal disorders, not elsewhere classified: Secondary | ICD-10-CM | POA: Insufficient documentation

## 2011-02-03 DIAGNOSIS — M2170 Unequal limb length (acquired), unspecified site: Secondary | ICD-10-CM

## 2011-02-03 DIAGNOSIS — E039 Hypothyroidism, unspecified: Secondary | ICD-10-CM | POA: Insufficient documentation

## 2011-02-03 DIAGNOSIS — M76899 Other specified enthesopathies of unspecified lower limb, excluding foot: Secondary | ICD-10-CM

## 2011-02-03 LAB — CONVERTED CEMR LAB
Blood in Urine, dipstick: NEGATIVE
Ketones, urine, test strip: NEGATIVE
Protein, U semiquant: NEGATIVE
Specific Gravity, Urine: 1.015
Urobilinogen, UA: 0.2

## 2011-02-04 ENCOUNTER — Telehealth: Payer: Self-pay | Admitting: Family Medicine

## 2011-02-04 NOTE — Telephone Encounter (Signed)
I called pt on SAT and gave her CT results.   GI referral has been made.  Make sure copy goes with her to GI appt.

## 2011-02-07 ENCOUNTER — Encounter (INDEPENDENT_AMBULATORY_CARE_PROVIDER_SITE_OTHER): Payer: BC Managed Care – PPO | Admitting: Psychology

## 2011-02-07 DIAGNOSIS — F411 Generalized anxiety disorder: Secondary | ICD-10-CM

## 2011-02-08 ENCOUNTER — Telehealth (INDEPENDENT_AMBULATORY_CARE_PROVIDER_SITE_OTHER): Payer: Self-pay | Admitting: Emergency Medicine

## 2011-02-14 ENCOUNTER — Encounter (INDEPENDENT_AMBULATORY_CARE_PROVIDER_SITE_OTHER): Payer: BC Managed Care – PPO | Admitting: Psychology

## 2011-02-14 DIAGNOSIS — F411 Generalized anxiety disorder: Secondary | ICD-10-CM

## 2011-02-19 NOTE — Op Note (Signed)
NAME:  Jade Dixon, Jade Dixon NO.:  [id number]   MEDICAL RECORD NO.:  [id number]          PATIENT TYPE:  POB   LOCATION:  [facility]                          FACILITY:  [facility]   PHYSICIAN:  Ginger Carne, MD  DATE OF BIRTH:  03/30/1981   DATE OF PROCEDURE:  07/02/2008  DATE OF DISCHARGE:                               OPERATIVE REPORT   PREOPERATIVE DIAGNOSIS:  Nonreassuring fetal heart rate.   POSTOPERATIVE DIAGNOSES:  1. Nonreassuring fetal heart rate.  2. Term viable delivery of a female infant.   PROCEDURE:  Primary low transverse cesarean section.   SURGEON:  Ginger Carne, MD   ASSISTANT:  None.   COMPLICATIONS:  None immediate.   ESTIMATED BLOOD LOSS:  1200 mL.   ANESTHESIA:  Epidural.   COMPLICATIONS:  Uterine atony.   OPERATIVE FINDINGS:  It is a term infant female delivered in a vertex  presentation.  Apgar and weight per delivery room record.  No gross  abnormalities and baby cried spontaneously at delivery.  Bulb suctioning  utilized.  NICU present.  Amniotic fluid was clear.  Uterus, tubes, and  ovaries showed normal decidual changes of pregnancy.   Significant uterine atony was noted particularly in the lower uterine  segment, efforts including 40 units of Pitocin per liter at high rate  usage, Methergine 0.2 mg IM, Hemabate 250 mcg IM, and 1000 mcg of  Cytotec per rectum were not sufficient to control bleeding.  Therefore,  a series of rectangular sutures in a metaloaf pattern using 0 Vicryl  suture was placed in a series from top to bottom beginning at the fundus  down to the lower uterine segment, beginning anteriorly, exiting  posteriorly, then coming around the back of the uterus, entering  posteriorly and then exiting anteriorly.  After sufficient numbers  sutures 1-2 cm apart were placed, the uterus was firm, minimal vaginal  bleeding noted, and the uterus was replaced back into the abdominal  cavity.  Bleeding points were  hemostatically  checked.  Blood clots removed.  Closure of the fascia with 0 PDS double  looped running suture and skin staples for the skin.  Instrument and  sponge count were correct.  The patient tolerated the procedure well,  returned to postanesthesia recovery room in excellent condition.      Ginger Carne, MD  Electronically Signed     SHB/MEDQ  D:  07/02/2008  T:  07/03/2008  Job:  [id number]

## 2011-02-19 NOTE — Assessment & Plan Note (Signed)
NAME:  Jade Dixon, Jade Dixon NO.:  [id number]   MEDICAL RECORD NO.:  [id number]          PATIENT TYPE:  POB   LOCATION:  [facility] [place]         FACILITY:  [facility]   PHYSICIAN:  Elsie Lincoln, MD      DATE OF BIRTH:  [birth year]   DATE OF SERVICE:                                  CLINIC NOTE   Patient is 30 year old female who had repeat cesarean section,  approximately 15 months ago.  The patient has been having painful  intercourse.  The patient is exclusively breast-feeding and as with  physical exam is obvious that the patient is hypoestrogenic from the  breasts.  The patient has not had a period.  Has not had her menses.  The patient is also complaining of bowels coming from her vagina.  The  patient will have a trial of labor, I am certainly doubtful a  rectovaginal fistula to be present, is not associated with loss of  urine, is not associated with flatus, it is also not associated with any  vaginal discharge or bowel movement.   PHYSICAL EXAMINATION:  VITAL SIGNS:  Pulse 113, blood pressure 126/75,  weight 151, height 67 inches.  GENERAL:  A well nourished, well developed in no apparent distress.  HEENT:  Normocephalic, atraumatic.  CHEST:  Normal movement.  ABDOMEN:  Soft, nontender.  GENITALIA:  Tanner V.  Vagina atrophic, slightly tender for speculum  insertion.  Cervix closed, nontender.  On bimanual ,the anterior and  posterior walls of the vagina seem intact.  Rectovaginal exam, does not  feel any deficit or thinning.  This seems to be good perineal body  support.  No rectocele.  No cystocele.   ASSESSMENT AND PLAN:  A 30 year old female with atrophic vaginitis.  1. Vagifem.  2. Bowels from the vagina could be from the vaginal discharge.  A wet      prep was sent.  3. The ligaments of the uterus returned and the uterus becomes less      mobile after pregnancy.  Hopefully, there will be less air movement      in the vagina and bowels will stop.  If  it is still continuing      after 3 months, we will reevaluate and if possible need to send to      a specialist.  4. We will also reevaluate her atrophic vaginitis at that time.           ______________________________  Elsie Lincoln, MD     KL/MEDQ  D:  09/05/2010  T:  09/06/2010  Job:  [id number]

## 2011-02-19 NOTE — Assessment & Plan Note (Signed)
NAME:  Jade Dixon, Jade Dixon NO.:  [id number]   MEDICAL RECORD NO.:  [id number]          PATIENT TYPE:  POB   LOCATION:  [facility] at [place]         FACILITY:  [facility]   PHYSICIAN:  Maylon Cos, CNM    DATE OF BIRTH:  29-May-1981   DATE OF SERVICE:  05/28/2010                                  CLINIC NOTE   The patient is being seen at the Center for [facility],  [place].   REASON FOR TODAY'S VISIT:  Staple removal.   HISTORY OF PRESENT ILLNESS:  Jade Dixon is a 30 year old gravida 2, para 2-0-  0-2 who is 5 days status post from a repeat cesarean section on May 22, 2010, that was performed at [facility].  Her operation was  complicated by intraoperative hemorrhage and the patient lost  approximately 2500 mL of blood during the procedure, however bleeding  was controlled intraoperatively.  She did not require blood products and  was discharged on postoperative day #2 in stable status.  This delivery  produced a female infant weighing 9 pounds who is breastfeeding  exclusively without complications.  Today, Jade Dixon has no complaints other  than fatigue.  She states that her postpartum bleeding is minimal.  Breastfeeding is going well.  She has a minimal amount of nipple  tenderness on the left without signs and symptoms of infection.  She is  currently taking a prenatal vitamin daily and iron supplement twice  daily.   On examination, Jade Dixon is a pleasant Caucasian female in no apparent  distress.  Her vital signs are stable.  Blood pressure today is 126/85,  her weight is 161, and her pulse is 79.  HEENT exam is grossly normal.  Her breasts are full and nontender.  Nipples are erect with a scant  amount of milk.  Abdomen is soft and nontender.  Fundus is firm.  Incision is well healed.  Staples have been removed and Steri-Strips are  in place.  Lochia scant.  Extremities are with 2+ edema and normal  reflexes.   ASSESSMENT:  1. A 5-day status post  repeat low transverse cesarean section, doing      well.  2. Breastfeeding.  3. Anemia.   PLAN:  1. The patient should follow up in 4-5 weeks for her full routine      postpartum visit or sooner with problems.  2. Continue iron supplementation as prescribed.  3. Signs and symptoms of infection have been reviewed and the patient      verbalizes understanding.           ______________________________  Maylon Cos, CNM    SS/MEDQ  D:  05/28/2010  T:  05/29/2010  Job:  [id number]

## 2011-02-19 NOTE — Assessment & Plan Note (Signed)
NAME:  Jade Dixon, ANTHIS NO.:  [id number]   MEDICAL RECORD NO.:  [id number]          PATIENT TYPE:  POB   LOCATION:  [facility] at [place]         FACILITY:  [facility]   PHYSICIAN:  Sid Falcon, CNM  DATE OF BIRTH:  1981/02/08   DATE OF SERVICE:                                  CLINIC NOTE   CHIEF COMPLAINT:  Well-woman exam.   MED HISTORY:  No change since last visit.  The patient is currently  breastfeeding.   GYN:  Last Pap smear in February 2009 with ASCUS with positive HPV  reflex, was pregnant at that time, so the colposcopy was not done.   CURRENT MEDICATIONS:  Prenatal vitamins.   ALLERGIES:  No known drug allergies.   The patient is using condoms for birth control and does not desire any  hormonal contraceptive at this time.   PHYSICAL EXAMINATION:  VITAL SIGNS:  Pulse 84, blood pressure 118/73,  weight 150, and height 67 inches.  GENERAL:  The patient is alert and oriented x3.  No signs of acute  distress.  NECK:  No thyromegaly.  BREASTS:  Soft and nontender.  No dominant masses.  No nipple discharge  or bleeding.  CARDIOVASCULAR:  Regular rate and rhythm without murmurs, gallops, or  rubs.  LUNGS:  Clear to auscultation bilaterally.  ABDOMEN:  No hepatosplenomegaly.  PELVIC:  Vagina, no abnormal lesions.  No abnormal discharge.  Cervix,  patent os.  No bleeding.  No abnormal discharge.  Negative cervical  motion tenderness.  Uterus midline, mobile, and nontender with  palpation.  No dominant masses.  Adnexa nontender with palpation and no  dominant masses.  EXTREMITIES:  Without edema.   ASSESSMENT:  1. Well-woman exam.  2. Breast feeding.  3. Atypical squamous cells of undetermined significance with positive      Human Papillomavirus reflex.   PLAN:  The patient is to schedule a colposcopy in the next 2-4 weeks.  I  reviewed the different forms of birth control method including the IUD.  The patient will consider and if decided will  call to office for an  followup appointment.      Sid Falcon, CNM     WM/MEDQ  D:  11/25/2008  T:  11/25/2008  Job:  [id number]

## 2011-02-19 NOTE — Discharge Summary (Signed)
NAME:  Jade Dixon, Jade Dixon NO.:  [id number]   MEDICAL RECORD NO.:  [id number]         PATIENT TYPE:  WINP   LOCATION:                                FACILITY:  [facility]   PHYSICIAN:  Lesly Dukes, M.D. DATE OF BIRTH:  Jul 28, 1981   DATE OF ADMISSION:  07/01/2008  DATE OF DISCHARGE:  07/05/2008                               DISCHARGE SUMMARY   FINAL DIAGNOSES:  1. Primary low transverse cesarean section.  2. Mild preeclampsia.  3. Medical induction of labor.  4. Postdates pregnancy.   REASON FOR ADMISSION:  This is a 30 year old primigravida, 41-5/7 weeks  by good dating criteria, who was followed prenatally at [facility] for [employer] with no complications, until she was noted  to have gestational hypertension, at which time she was brought in for  induction of labor being 41-5/7 weeks.   PERTINENT LABORATORY FINDINGS:  Her preoperative hemoglobin was 13.5,  postoperatively 11.6, platelet count 144, serum creatinine 0.53, and  glucose 88. LFTs normal.  Uric acid had been mildly elevated immediately  prior to admission.   HOSPITAL COURSE:  The patient was admitted for induction and was noted  to have unfavorable cervix 1cm dilated, 50% efaced, -3 station.  Foley  bulb placed in through the cervix.  She progressed in active labor on  Pitocin and had IUPC documenting adequate labor with MVUs greater than  200.  She had a protracted second stage and pushed for a couple of hours  and had some pushing variables.  The decision was made by Dr. Mia Creek  to proceed with primary low transverse C-section for nonreassuring fetal  heart rate, this was complicated by mild uterine atony requiring  Methergine, Hemabate, and Cytotec.  The EBL was 1200.  Postoperatively,  she did well and was breast-feeding.  On postpartum day #3, she remained  normotensive.  Fundus was felt to be mildly sub-involuted with a full  bladder at the time of exam.  Her staples were  removed.  Steri-Strips  were applied.   DISCHARGE CONDITION:  Good.   MEDICATIONS:  1. Prenatal vitamin 1 a day.  2. Percocet 5/325 one or two q.4-6 h. p.r.n.  3. Ibuprofen 600 mg 1 p.o. q.6 h.   For family planning, she was to use abstinence and condoms as well as  breast-feeding without supplementation.  This issue will be revisited at  her 6-week check.  Her followup was to be at [facility]  [facility], to come in 2 weeks for an incision check or sooner  if any problems.  Also, followup blood pressure at that time.      Deirdre Christy Gentles, C.N.M.      Lesly Dukes, M.D.  Electronically Signed    DP/MEDQ  D:  08/11/2008  T:  08/12/2008  Job:  [id number]

## 2011-02-19 NOTE — Assessment & Plan Note (Signed)
NAME:  Jade Dixon, Jade Dixon NO.:  [id number]   MEDICAL RECORD NO.:  [id number]          PATIENT TYPE:  POB   LOCATION:  [facility] at [place]         FACILITY:  [facility]   PHYSICIAN:  Sid Falcon, CNM  DATE OF BIRTH:  [birth year]   DATE OF SERVICE:  07/06/2010                                  CLINIC NOTE   LOCATION:  Center for [facility], [place].   REASON FOR VISIT:  Postpartum exam.   The patient is here status post a repeat C-section on May 22, 2010.  C-section resulted in a postpartum hemorrhage; however, the patient was  doing well at her staple removal with no further complications.  Today,  she is here reporting that she bled approximately 3 weeks after delivery  and stopped and then resumed in 5 or 7 days, later had some white pink  spotting since with wiping.  Currently, breastfeeding without  difficulty, has not resumed sexual intercourse and has prescription for  Micronor, in which she plans to fill today.  The patient denies any  issue with postpartum depression, states that she has occasional sadness  with decreased sleeping of the infant.  The patient verbalizes adequate  family support with in-laws now assisting with child care.  No other  problems or concerns identified.   PHYSICAL EXAMINATION:  VITAL SIGNS:  Stable.  CARDIOVASCULAR:  Regular rate and rhythm without murmurs, gallops or  rubs.  LUNGS:  Clear to auscultation.  BREASTS:  Soft, nontender, no dominant masses.  No nipple discharge.  No  retractions.  ABDOMEN:  No hepatosplenomegaly.  Positive bowel sounds x4.  Incision  site without signs of infection, well approximated, healing well.  PELVIC:  Uterus involuted.  Vagina, slight pink discharge seen.  No  abnormal lesions.  Cervix, well visualized, small-appearing cervix,  patent os.  Pap smear obtained without difficulty.  Uterus; mobile,  midline and nontender with palpation.  Adnexa, nontender with palpation.  No  dominant masses.   ASSESSMENT:  Normal postpartum exam.   PLAN:  Pap smear to lab to alleviate normal postpartum healing.  Discussed that she can return to the clinic for any problems or  concerns.  After filling the Micronor taken, have a backup method for 2  weeks.  Empathized the importance of taking a pill at the same time  every day.  The patient will follow up in 1 year for well-woman exam or  sooner if needed.      Sid Falcon, CNM    WM/MEDQ  D:  07/06/2010  T:  07/07/2010  Job:  [id number]

## 2011-02-19 NOTE — Assessment & Plan Note (Signed)
NAME:  Jade Dixon, Jade Dixon NO.:  [id number]   MEDICAL RECORD NO.:  [id number]          PATIENT TYPE:  POB   LOCATION:  [facility] at [place]         FACILITY:  [facility]   PHYSICIAN:  Maylon Cos, CNM    DATE OF BIRTH:  01-24-1981   DATE OF SERVICE:  10/12/2010                                  CLINIC NOTE   Reason for today's visit is abdominal bloating and rectal pressure.   HISTORY OF PRESENT ILLNESS:  The patient states that she has had  abdominal bloating, she started Vagifem approximately 1 month ago.  Abdominal bloating started, has been happening for 1 week.  She went off  the Vagifem 1 week prior to her visit and abdominal bloating and rectal  pressure has not resolved.  She also has complaints of loose and formed  stools.  She is having both daily.  She was originally started on the  Vagifem by Dr. Elsie Lincoln due to some complaints of pain with  intercourse.  She is diagnosed with atrophic vaginitis, and has had some  improvement with the Vagifem.   PHYSICAL EXAMINATION:  GENERAL:  [name] is a pleasant 30 year old  Caucasian female who is in no apparent distress.  VITAL SIGNS:  Stable.  Her pulse is 88.  Her blood pressure is 121/83.  Her weight is 149 and  her height is 67 inches.  ABDOMEN:  Soft and nondistended.  She has mild tenderness which is  diffuse, normal tympany, normal bowel sounds.  No masses.  No  hepatosplenomegaly.  PELVIC:  Genitalia are without lesions.  Mucous membranes are pink  without lesions, scant amount of creamy discharge without odor,  nonfriable, nontender, and nonenlarged uterus.  No cervical motion  tenderness.  Adnexa are not enlarged and nontender.   ASSESSMENT:  Abdominal bloating and rectal pressure of unknown etiology  questionable gynecologic related.   PLAN:  We will assess UA and send urine culture to assess for urinary  relation.  I have discussed with Jeannine whether or not this is GYN  related, given the fact that  she is having both loose and formed stool  and the fact that she has a history of irritable bowel syndrome, a  question whether this is a flare of IBS.  I have encouraged her to start  some probiotics.  Our office will contact her with the results of her  urine culture results if these are normal we will refer her to Primary  Care or Gastroenterology for further evaluation of her symptoms if they  have not resolved at the time of her course.           ______________________________  Maylon Cos, CNM     SS/MEDQ  D:  10/15/2010  T:  10/16/2010  Job:  [id number]

## 2011-02-22 NOTE — Assessment & Plan Note (Signed)
NAME:  Jade Dixon, Jade Dixon NO.:  [id number]   MEDICAL RECORD NO.:  [id number]          PATIENT TYPE:  POB   LOCATION:  [facility] at [place]         FACILITY:  [facility]   PHYSICIAN:  Maylon Cos, CNM    DATE OF BIRTH:  Feb 02, 1981   DATE OF SERVICE:                                  CLINIC NOTE   The patient was seen on August 26, 2008, at [facility] for  her 8-week postpartum visit.  Jade Dixon presents today, 8 weeks status post  primary cesarean section for nonreassuring fetal heart rate.  Her  section was performed by Dr. Blima Rich on July 02, 2008, and  delivered a healthy female weighing 8 pounds and 4 ounces.  She was 41  weeks and 6 days at the time of her delivery.  Her postpartum course has  been complicated by minor suppuration of her cesarean incision that was  cared for by Jade Dixon, CNM,  and was first evaluated on July 18, 2008.  Second complication was 2 weeks prior to her postpartum visit on  the August 26, 2008, where the patient called the office complaining  of something coming out of her vagina.  She was sent to the [facility] and evaluated by Jade Dixon.  At that time, some retained  tissue was found and removed.  A 10 cm piece of membranous tissue was  removed from the vagina.  There were no signs and symptoms of infection.  The patient was released after being cared for in triage and pathology  showed that it was necrotic membranes measuring 10 cm.  The patient  presents today with no complaints.  She is doing well.  She is currently  taking prenatal vitamins and DHA supplements.  She is breast feeding her  son and that is going well.  She has some minimal complaints of  postpartum blues that lasted for approximately 2-3 days within the first  2 weeks.  Being postpartum, however, now she states that the feelings  have resolved and she denies any signs and symptoms of postpartum  depression.  The patient had multiple  questions pertaining to her  delivery and has feelings that her body has failed her saying that she  was a healthy female, easily became pregnant, and the end of her  pregnancy was complicated by some gestational hypertension and  ultimately a cesarean section for nonreassuring heart rate.  Postpartum  course was complicated by minor wound suppuration and also retained  tissue.  The patient is grieving the loss of her birth plan in an  appropriate fashion and we talked at length about appropriate feelings,  appropriate grieving, and coming to terms in peace with the delivery  that she had and the patient feels like that she is getting closer with  the loss of her perfect delivery plan and is very thankful for a happy  and healthy infant despite the complications that she has had that she  is doing very well.  On examination, today the patient's vital signs are  stable.  Her blood pressure is 121/87.  Her weight today is 154.  She is  in no apparent distress.  She is in pleasant mood and affect.  On  examination of her cesarean scar, it is healing well with no signs of  infection.  No suppuration. On assessment and plan today, Jade Dixon is a 30-  year-old, G1, P1-0-0-1, 8 weeks' status post primary cesarean section  for nonreassuring fetal heart rate.  She is doing well.  She is breast  feeding.  At this time, she has not resumed intercourse.  However, she  does not plan on using any hormonal methods of birth control.  Options  were discussed that are safe and compatible with breast feeding and  literature was given.  The patient is due for annual exam and Pap smear  in February 2010.  Emotionally, she is doing well without signs and  symptoms of postpartum depression, however, was counseled regarding  signs and symptoms.  The patient  was counseled to continue her prenatal vitamins during the duration of  her breast feeding and plans to follow up in February for her annual  exam, Pap smear,  or p.r.n. problems.           ______________________________  Maylon Cos, CNM     SS/MEDQ  D:  09/09/2008  T:  09/10/2008  Job:  [id number]

## 2011-02-27 ENCOUNTER — Encounter (INDEPENDENT_AMBULATORY_CARE_PROVIDER_SITE_OTHER): Payer: BC Managed Care – PPO | Admitting: Psychology

## 2011-02-27 DIAGNOSIS — F411 Generalized anxiety disorder: Secondary | ICD-10-CM

## 2011-03-06 ENCOUNTER — Encounter (HOSPITAL_COMMUNITY): Payer: BC Managed Care – PPO | Admitting: Psychology

## 2011-03-11 ENCOUNTER — Encounter (INDEPENDENT_AMBULATORY_CARE_PROVIDER_SITE_OTHER): Payer: BC Managed Care – PPO | Admitting: Psychology

## 2011-03-11 DIAGNOSIS — F411 Generalized anxiety disorder: Secondary | ICD-10-CM

## 2011-03-15 ENCOUNTER — Encounter: Payer: Self-pay | Admitting: Family Medicine

## 2011-03-15 ENCOUNTER — Ambulatory Visit (INDEPENDENT_AMBULATORY_CARE_PROVIDER_SITE_OTHER): Payer: BC Managed Care – PPO | Admitting: Family Medicine

## 2011-03-15 DIAGNOSIS — K589 Irritable bowel syndrome without diarrhea: Secondary | ICD-10-CM

## 2011-03-15 DIAGNOSIS — E038 Other specified hypothyroidism: Secondary | ICD-10-CM

## 2011-03-15 DIAGNOSIS — F419 Anxiety disorder, unspecified: Secondary | ICD-10-CM

## 2011-03-15 DIAGNOSIS — R141 Gas pain: Secondary | ICD-10-CM

## 2011-03-15 DIAGNOSIS — E039 Hypothyroidism, unspecified: Secondary | ICD-10-CM

## 2011-03-15 DIAGNOSIS — F411 Generalized anxiety disorder: Secondary | ICD-10-CM

## 2011-03-15 DIAGNOSIS — R140 Abdominal distension (gaseous): Secondary | ICD-10-CM | POA: Insufficient documentation

## 2011-03-15 NOTE — Patient Instructions (Signed)
Labs today. Will call you with results on Monday.  Try an OTC probiotic like Philips Colon Health + a daily fiber supplement.  Try a Gluten Free diet for 2 wks.   Return for a nurse BP/ HR check in 2 ks.

## 2011-03-15 NOTE — Assessment & Plan Note (Signed)
Likely to be related to IBS.  She can certainly try a gluten free diet for 2 wks to see if this helps with the bloating and gas.

## 2011-03-15 NOTE — Assessment & Plan Note (Signed)
She started levothyroxine 6 wks so for symptomatic subclinical hypothroidism and is feeling better but she is tachycardic with a rise in her BP today.  Will recheck thyroid labs and have her return for a nurse BP/ HR check in 2 wks.

## 2011-03-15 NOTE — Assessment & Plan Note (Signed)
She saw Dr Rhetta Mura but had SEs from the Hyoscamine.  Working on stress reduction, dietary changes and will have her add a probiotic and a fiber supplement daily.

## 2011-03-15 NOTE — Assessment & Plan Note (Signed)
Has improved a little with coiunseling.  Declined RX meds and is not taking the Atarax prn.  I suspect this is the reason for her rise in BP and HR.

## 2011-03-15 NOTE — Progress Notes (Signed)
  Subjective:    Patient ID: Jade Dixon, female    DOB: [birth year], 30 y.o.   MRN: [id number]  HPI  30 yo WF presents for f/u visit.  She saw GI for her abdominal pain.  Dr Rhetta Mura diagnosed her with IBS.  She is not using the Hyoscamine that he prescribed.  She has had a little diarrhea and continues to have gas, cramping and bloating on and off.  Denies blood in stool or nausea.  She was started on levothyroxine which has helped.  She is seeing a Veterinary surgeon.  She had a normal CT of the abdomen and pelvis.  She is due to have her thyroid rechecked after starting meds 6 wks ago.  BP 141/93  Pulse 120  Ht 5\' 7"  (1.702 m)  Wt 138 lb (62.596 kg)  BMI 21.61 kg/m2  SpO2 100%   Review of Systems  Constitutional: Positive for unexpected weight change. Negative for fever, chills and fatigue.  Respiratory: Negative for shortness of breath.   Cardiovascular: Negative for chest pain, palpitations and leg swelling.  Gastrointestinal: Positive for diarrhea and abdominal distention. Negative for nausea, vomiting, abdominal pain, constipation, blood in stool and rectal pain.  Genitourinary: Negative for dysuria.  Skin: Negative for color change.  Neurological: Negative for headaches.  Psychiatric/Behavioral: Negative for dysphoric mood. The patient is nervous/anxious.        Objective:   Physical Exam  Constitutional: She appears well-developed.  HENT:  Mouth/Throat: Oropharynx is clear and moist.  Eyes: No scleral icterus.  Neck: Neck supple. No thyromegaly present.  Cardiovascular: Regular rhythm and normal heart sounds.  Tachycardia present.   Pulmonary/Chest: Effort normal and breath sounds normal. No respiratory distress. She has no wheezes.  Abdominal: There is tenderness (slightly tender over epigastrium). There is no guarding.  Skin: Skin is dry.  Psychiatric: She has a normal mood and affect.          Assessment & Plan:

## 2011-03-16 LAB — T4, FREE: Free T4: 1.13 ng/dL (ref 0.80–1.80)

## 2011-03-16 LAB — T3, FREE: T3, Free: 2.9 pg/mL (ref 2.3–4.2)

## 2011-03-16 LAB — TSH: TSH: 5.289 u[IU]/mL — ABNORMAL HIGH (ref 0.350–4.500)

## 2011-03-18 ENCOUNTER — Telehealth: Payer: Self-pay | Admitting: Family Medicine

## 2011-03-18 LAB — GLIADIN ANTIBODIES, SERUM: Gliadin IgA: 9.1 U/mL (ref <20)

## 2011-03-18 NOTE — Telephone Encounter (Signed)
Pls let pt know that she is still HYPOthyroid, but before I can increase her dose of levothyroxine, we need to get her BP and HR down.   She tested NEGATIVE for Celiac Dz but may still be Gluten Intolerant, so I would proceed with a  2 wk elimination diet to see if she feels better.  Have her return for a nurse BP/ HR check in the next 2 wks and if still elevated, will get an EKG.

## 2011-03-18 NOTE — Telephone Encounter (Signed)
Pt aware of the above and will call to schedule.

## 2011-03-25 ENCOUNTER — Encounter (HOSPITAL_COMMUNITY): Payer: BC Managed Care – PPO | Admitting: Psychology

## 2011-03-26 ENCOUNTER — Encounter (INDEPENDENT_AMBULATORY_CARE_PROVIDER_SITE_OTHER): Payer: BC Managed Care – PPO | Admitting: Psychology

## 2011-03-26 DIAGNOSIS — F411 Generalized anxiety disorder: Secondary | ICD-10-CM

## 2011-04-08 ENCOUNTER — Encounter: Payer: Self-pay | Admitting: Family Medicine

## 2011-04-08 ENCOUNTER — Ambulatory Visit (INDEPENDENT_AMBULATORY_CARE_PROVIDER_SITE_OTHER): Payer: BC Managed Care – PPO | Admitting: Family Medicine

## 2011-04-08 VITALS — BP 138/87 | HR 120 | Wt 137.0 lb

## 2011-04-08 DIAGNOSIS — E038 Other specified hypothyroidism: Secondary | ICD-10-CM

## 2011-04-08 DIAGNOSIS — R000 Tachycardia, unspecified: Secondary | ICD-10-CM

## 2011-04-08 DIAGNOSIS — E039 Hypothyroidism, unspecified: Secondary | ICD-10-CM

## 2011-04-08 NOTE — Progress Notes (Signed)
  Subjective:    Patient ID: Jade Dixon, female    DOB: 08-Jan-1981, 30 y.o.   MRN: [id number]  HPI  30 yo WF presents for f/u visit.  She is on synthroid 25 mcg / day for subclinical hypothyroidism but her BP and HR are still high.  Her thyroid antibotdies were neg last checked.  She continues to have IBS symptoms, heart palpitations and hair loss.  Weight is stable.  Stress level is a little less.  BP 138/87  Pulse 120  Wt 137 lb (62.143 kg)  SpO2 99%  Review of Systems  Constitutional: Positive for fatigue.  Respiratory: Negative for shortness of breath.   Cardiovascular: Positive for palpitations. Negative for chest pain.  Neurological: Negative for headaches.  Psychiatric/Behavioral: Negative for sleep disturbance. The patient is nervous/anxious.        Objective:   Physical Exam  Constitutional: She appears well-developed and well-nourished. No distress.  HENT:  Mouth/Throat: Oropharynx is clear and moist.  Neck: Neck supple. Thyromegaly (tenderness, no bruit) present.  Cardiovascular: Regular rhythm, S1 normal, S2 normal and normal heart sounds.  Tachycardia present.   Pulmonary/Chest: Effort normal and breath sounds normal.  Musculoskeletal: She exhibits no edema.  Lymphadenopathy:    She has no cervical adenopathy.  Skin: Skin is warm and dry.       Alopecia at the crown  Psychiatric: She has a normal mood and affect.          Assessment & Plan:

## 2011-04-08 NOTE — Patient Instructions (Signed)
EKG today.  Labs today.  Will call you w/ results in 2 days.  Will set up a thyroid u/s and endocrinology referral.  Return for f/u as needed.

## 2011-04-09 ENCOUNTER — Telehealth: Payer: Self-pay | Admitting: Family Medicine

## 2011-04-09 MED ORDER — METOPROLOL TARTRATE 25 MG PO TABS: 25.0000 mg | ORAL_TABLET | Freq: Two times a day (BID) | ORAL | Status: DC

## 2011-04-09 NOTE — Assessment & Plan Note (Addendum)
Her TSH is still elevated but she has elevated BPs and HR.  She is still symptomatic also.  Her anti TPO is still normal today.  Will have endocrinology see her for further eval and treatment.  Will update and thyroid u/s.

## 2011-04-09 NOTE — Telephone Encounter (Signed)
Pt aware of the above  

## 2011-04-09 NOTE — Telephone Encounter (Signed)
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  This explains her symptoms.  Fax copy of labs to endocrinology for upcoming visit.   Will start her on low dose metoprolol 2 x a day to get her HR/ BP down since this is from the thyroiditis.

## 2011-04-11 ENCOUNTER — Ambulatory Visit
Admission: RE | Admit: 2011-04-11 | Discharge: 2011-04-11 | Disposition: A | Discharge status: Home / Self Care | Payer: BC Managed Care – PPO | Source: Ambulatory Visit | Attending: Family Medicine | Admitting: Family Medicine

## 2011-04-11 ENCOUNTER — Telehealth: Payer: Self-pay | Admitting: Family Medicine

## 2011-04-11 DIAGNOSIS — R000 Tachycardia, unspecified: Secondary | ICD-10-CM

## 2011-04-11 NOTE — Telephone Encounter (Signed)
Pls let pt know that her thyroid u/s shows normal size w/o nodules but had increased vascular flow c/w thyroiditis.  Fax copy to endocrinology for upcoming appt and expedite her appt info.

## 2011-04-11 NOTE — Telephone Encounter (Signed)
Pt aware.

## 2011-04-24 ENCOUNTER — Encounter: Payer: Self-pay | Admitting: Family Medicine

## 2011-04-29 ENCOUNTER — Encounter (INDEPENDENT_AMBULATORY_CARE_PROVIDER_SITE_OTHER): Payer: BC Managed Care – PPO | Admitting: Psychology

## 2011-04-29 DIAGNOSIS — F411 Generalized anxiety disorder: Secondary | ICD-10-CM

## 2011-05-21 ENCOUNTER — Telehealth: Payer: Self-pay | Admitting: Family Medicine

## 2011-05-21 NOTE — Telephone Encounter (Signed)
Pt called and left mess for the triage nurse that she had been experiencing Chest pain now for 4 days.  Had a workup for this a couple yrs ago with card in [place].  Diagnosed atypical chest pain.  Pt complains today that her pain is # 5-6/10 and constant, +Lt arm numbness and tingling, +upper back pain, +indigestion, +lightheadedness.  She does say she has some acute symptoms such as +post nasal drip, ST, and bilateral ear pain without fever present.   Plan:  Since the pt has been diagnosed with atypical chest pain and seen card in the past it was recommended for her to go back to the ED for further workup and especially since two yrs had past and things can change in that length of time.  Since afebrile less likely the chest pain has anything to do with the acute symptoms, but could have two diff problems going at the same time.  Told the pt to go to the ED and even if not cardiac issues +, then they could eval the acute symptoms as well.  Pt voiced understanding. Jarvis Newcomer, LPN [place]

## 2011-05-28 ENCOUNTER — Encounter: Payer: Self-pay | Admitting: Family Medicine

## 2011-05-28 ENCOUNTER — Ambulatory Visit (INDEPENDENT_AMBULATORY_CARE_PROVIDER_SITE_OTHER): Payer: BC Managed Care – PPO | Admitting: Family Medicine

## 2011-05-28 DIAGNOSIS — R000 Tachycardia, unspecified: Secondary | ICD-10-CM

## 2011-05-28 DIAGNOSIS — R9431 Abnormal electrocardiogram [ECG] [EKG]: Secondary | ICD-10-CM

## 2011-05-28 DIAGNOSIS — E063 Autoimmune thyroiditis: Secondary | ICD-10-CM | POA: Insufficient documentation

## 2011-05-28 DIAGNOSIS — K589 Irritable bowel syndrome without diarrhea: Secondary | ICD-10-CM

## 2011-05-28 MED ORDER — METOPROLOL TARTRATE 25 MG PO TABS: 25.0000 mg | ORAL_TABLET | Freq: Two times a day (BID) | ORAL | Status: DC

## 2011-05-28 NOTE — Assessment & Plan Note (Signed)
Had this on recent EKG (483) and [facility].  Since she has this and resting tachycardia, needing a beta blocker to control (endo does not think this is thyroid- related), will ask Dr Jens Som to evaluate.  Check electrolytes today.

## 2011-05-28 NOTE — Assessment & Plan Note (Signed)
Doing well on higher dose of thyroid medication.  Has f/u with endo.

## 2011-05-28 NOTE — Assessment & Plan Note (Signed)
Still having symptoms on and off.  Will get her cleared from cards prior to recommending regular exercise which would help her overall stress and anxiety.

## 2011-05-28 NOTE — Progress Notes (Signed)
  Subjective:    Patient ID: Jade Dixon, female    DOB: 10-Jun-1981, 30 y.o.   MRN: [id number]  HPI  30 yo WF presents for f/u visit.  She saw Dr Margarita Grizzle in July and was diagnosed with Hashimoto's thyroiditis with hypothyroidism.  She was increased on her levothyroxine 37 mcg daily.  He thought her resting tachycardia was not related to her thyroid and recommended that she stay on Metoprolol.  Using condoms for birth control.  Still having hair loss and some GERD and IBS symptoms but not as bad.  Denies feeling stressed out or anxious.  Denies heart palpitations.  BP 113/72  Pulse 76  Wt 139 lb (63.05 kg)  SpO2 100%   Review of Systems  Constitutional: Negative for appetite change, fatigue and unexpected weight change.  Respiratory: Negative for shortness of breath.   Cardiovascular: Positive for chest pain (one episode). Negative for palpitations.  Gastrointestinal: Positive for abdominal pain (on and off) and diarrhea. Negative for blood in stool.  Psychiatric/Behavioral: Negative for sleep disturbance and dysphoric mood. The patient is not nervous/anxious.        Objective:   Physical Exam  Constitutional: She appears well-developed and well-nourished.  HENT:  Mouth/Throat: Oropharynx is clear and moist.  Neck: No thyromegaly present.  Cardiovascular: Normal rate, regular rhythm and normal heart sounds.   Pulmonary/Chest: Effort normal and breath sounds normal.  Musculoskeletal: She exhibits no edema.  Skin:       Hair thinning at the crown  Psychiatric: She has a normal mood and affect.          Assessment & Plan:

## 2011-05-28 NOTE — Patient Instructions (Signed)
Update labs (non fasting) one day downstairs.  Will have Dr Jens Som see you for prolonged QTc/ resting tachycardia.  F/u for thyroid with Dr Margarita Grizzle.  Best of luck to you!!

## 2011-06-01 LAB — COMPLETE METABOLIC PANEL WITH GFR
ALT: 11 U/L (ref 0–35)
CO2: 29 mEq/L (ref 19–32)
Calcium: 9.7 mg/dL (ref 8.4–10.5)
Chloride: 101 mEq/L (ref 96–112)
GFR, Est African American: >60 mL/min (ref >60)
Potassium: 4.3 mEq/L (ref 3.5–5.3)
Sodium: 138 mEq/L (ref 135–145)
Total Bilirubin: 0.5 mg/dL (ref 0.3–1.2)
Total Protein: 7.2 g/dL (ref 6.0–8.3)

## 2011-06-05 ENCOUNTER — Telehealth: Payer: Self-pay | Admitting: Family Medicine

## 2011-06-05 NOTE — Telephone Encounter (Signed)
Pt.notified

## 2011-06-05 NOTE — Telephone Encounter (Signed)
Call pt: CMP and magnesium are nl excpet sugar was elevated but she may not have been fasting.

## 2011-06-14 ENCOUNTER — Encounter: Payer: Self-pay | Admitting: Cardiology

## 2011-06-19 ENCOUNTER — Encounter: Payer: Self-pay | Admitting: Cardiology

## 2011-06-19 ENCOUNTER — Ambulatory Visit (INDEPENDENT_AMBULATORY_CARE_PROVIDER_SITE_OTHER): Payer: BC Managed Care – PPO | Admitting: Cardiology

## 2011-06-19 DIAGNOSIS — R9431 Abnormal electrocardiogram [ECG] [EKG]: Secondary | ICD-10-CM

## 2011-06-19 DIAGNOSIS — E063 Autoimmune thyroiditis: Secondary | ICD-10-CM

## 2011-06-19 NOTE — Progress Notes (Signed)
HPI: 30 year old female for evaluation of prolonged QT interval. She apparently had a workup for chest pain approximately 6 years ago in [place]. She had a stress test and an echo. They apparently were unremarkable I do not have his records available. She denies dyspnea on exertion, orthopnea, PND or pedal edema, palpitations, edema or chest pain. She has never had a syncopal episode. There is no family history of sudden death. She was recently noted to have a mildly prolonged QT interval her electrocardiogram and we were asked to further evaluate  Current Outpatient Prescriptions  Medication Sig Dispense Refill  . levothyroxine (SYNTHROID, LEVOTHROID) 50 MCG tablet Take 50 mcg by mouth daily.        . metoprolol tartrate (LOPRESSOR) 25 MG tablet Take 1 tablet (25 mg total) by mouth 2 (two) times daily.  60 tablet  3  . Prenat-FeFum-FePo-FA-Omega 3 (TARON-C DHA) 53.5-38-1 MG CAPS         No Known Allergies  Past Medical History  Diagnosis Date  . Hypothyroidism   . Hypertension   . Hashimoto's disease   . IBS (irritable bowel syndrome)     Past Surgical History  Procedure Date  . Cesarean section 2009, 2012    History   Social History  . Marital Status: Married    Spouse Name: N/A    Number of Children: 2  . Years of Education: N/A   Occupational History  .      Athletic trainer   Social History Main Topics  . Smoking status: Never Smoker   . Smokeless tobacco: Not on file  . Alcohol Use: Yes     Occasional  . Drug Use: Not on file  . Sexually Active: Yes   Other Topics Concern  . Not on file   Social History Narrative  . No narrative on file    Family History  Problem Relation Age of Onset  . Diabetes Father     type 2  . Hyperlipidemia Father     ROS: no fevers or chills, productive cough, hemoptysis, dysphasia, odynophagia, melena, hematochezia, dysuria, hematuria, rash, seizure activity, orthopnea, PND, pedal edema, claudication. Remaining systems  are negative.  Physical Exam: General:  Well developed/well nourished in NAD Skin warm/dry Patient not depressed No peripheral clubbing Back-normal HEENT-normal/normal eyelids Neck supple/normal carotid upstroke bilaterally; no bruits; no JVD; no thyromegaly chest - CTA/ normal expansion CV - RRR/normal S1 and S2; no murmurs, rubs or gallops;  PMI nondisplaced Abdomen -NT/ND, no HSM, no mass, + bowel sounds, no bruit 2+ femoral pulses, no bruits Ext-no edema, chords, 2+ DP Neuro-grossly nonfocal  ECG 04/08/11 sinus rhythm with RV conduction delay and mildly prolonged QT interval. Sinus rhythm at a rate of 66. RV conduction delay. No prolongation of QT.

## 2011-06-19 NOTE — Assessment & Plan Note (Signed)
Management per primary care. 

## 2011-06-19 NOTE — Assessment & Plan Note (Signed)
Patient had a mildly prolonged QT interval on April 08, 2011. Followup electrocardiogram showed no prolongation of QT. There is no history of syncope or palpitations. No family history of sudden death. I do not think further cardiac evaluation is necessary. Patient given a list of medications that can prolong QT interval and avoid.

## 2011-07-08 LAB — CBC
HCT: 33.6 — ABNORMAL LOW
HCT: 40
Hemoglobin: 11.6 — ABNORMAL LOW
MCHC: 33.7
MCHC: 34
MCHC: 34.4
MCV: 95.9
MCV: 96.4
Platelets: 159
RBC: 3.47 — ABNORMAL LOW
RBC: 4.43
RDW: 13.6
RDW: 13.7
WBC: 8

## 2011-07-08 LAB — COMPREHENSIVE METABOLIC PANEL
AST: 19
AST: 22
Albumin: 2.8 — ABNORMAL LOW
BUN: 3 — ABNORMAL LOW
CO2: 24
Calcium: 8.9
Calcium: 9.3
Creatinine, Ser: 0.53
Creatinine, Ser: 0.62
GFR calc Af Amer: >60
GFR calc Af Amer: >60
GFR calc non Af Amer: >60
Total Protein: 5.7 — ABNORMAL LOW

## 2011-07-08 LAB — URINALYSIS, ROUTINE W REFLEX MICROSCOPIC
Bilirubin Urine: NEGATIVE
Nitrite: NEGATIVE
Protein, ur: NEGATIVE
Specific Gravity, Urine: <1.005 — ABNORMAL LOW
Urobilinogen, UA: 0.2

## 2011-09-09 NOTE — Telephone Encounter (Signed)
  Phone Note Outgoing Call Call back at [facility] Phone [phone]   Call placed by: Emilio Math,  Feb 08, 2011 10:08 AM Call placed to: Patient Summary of Call: Left msg hope patient is feeling better call with any questions or concerns

## 2011-09-09 NOTE — Progress Notes (Signed)
Summary: abdominal/back pain/TM (rm 2)   Vital Signs:  Patient Profile:   30 Years Old Female CC:      left trunk pain x 3 weeks Height:     67 inches (170.18 cm) Weight:      140 pounds O2 Sat:      100 % O2 treatment:    Room Air Temp:     98.5 degrees F oral Pulse rate:   100 / minute Resp:     18 per minute BP sitting:   141 / 89  (left arm) Cuff size:   regular  Pt. in pain?   yes    Location:   left abdomen and left back    Intensity:   6    Type:       burning                   Updated Prior Medication List: PRENATAL VITAMINS 0.8 MG TABS (PRENATAL MULTIVIT-MIN-FE-FA)  LEVOTHYROXINE SODIUM 25 MCG TABS (LEVOTHYROXINE SODIUM) once daily HYDROXYZINE HCL 25 MG/ML SOLN (HYDROXYZINE HCL)   Current Allergies: No known allergies History of Present Illness Chief Complaint: left trunk pain x 3 weeks History of Present Illness:  Subjective:  Patient complains of a burning pain in her left hip area and left lower back for about 3 weeks.  The pain is worse with walking and movement.  She recalls no trauma or change in activities.  No urinary symptoms.  She notes that she has a young infant that she carries on her left side.  REVIEW OF SYSTEMS Constitutional Symptoms      Denies fever, chills, night sweats, weight loss, weight gain, and fatigue.  Eyes       Denies change in vision, eye pain, eye discharge, glasses, contact lenses, and eye surgery. Ear/Nose/Throat/Mouth       Denies hearing loss/aids, change in hearing, ear pain, ear discharge, dizziness, frequent runny nose, frequent nose bleeds, sinus problems, sore throat, hoarseness, and tooth pain or bleeding.  Respiratory       Denies dry cough, productive cough, wheezing, shortness of breath, asthma, bronchitis, and emphysema/COPD.  Cardiovascular       Denies murmurs, chest pain, and tires easily with exhertion.    Gastrointestinal       Complains of stomach pain and constipation.      Denies nausea/vomiting,  diarrhea, blood in bowel movements, and indigestion.      Comments: bloating Genitourniary       Denies painful urination, kidney stones, and loss of urinary control.      Comments: urinary frequency Neurological       Denies paralysis, seizures, and fainting/blackouts. Musculoskeletal       Complains of muscle pain and joint stiffness.      Denies joint pain, decreased range of motion, redness, swelling, muscle weakness, and gout.  Skin       Denies bruising, unusual mles/lumps or sores, and hair/skin or nail changes.  Psych       Denies mood changes, temper/anger issues, anxiety/stress, speech problems, depression, and sleep problems. Other Comments: Patient c/o bloating and constipation for 4 months, saw OBGYN who did an [place] that was negative, referred to Dr. Cathey Endow who diagnosed it as constipation. Left trunk pain (anterior and posterior) for 3 weeks. CT done friday that was negative. [place] is to refer to GI, no appointment yet. Also has pain in her left glute that travels down her left leg. Urinary frequency with pain (3 days)  Past History:  Past Medical History: G2P2- [facility] 8 months post partum Hypothyroidism  Past Surgical History: _ Caesarean section 2009 & 2012  Family History: Reviewed history from 09/02/2006 and no changes required. 2 brothers healthy  father type II DM, high chol  mother healthy  Social History: Reviewed history from 01/07/2007 and no changes required. 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  Nonsmoker.  Runs 5 days a wk.  Has her masters.  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   Objective:  Appearance:  Patient appears healthy, stated age, and in no acute distress  Eyes:  Pupils are equal, round, and reactive to light and accomdation.  Extraocular movement is intact.  Conjunctivae are not inflamed.  Pharynx:  Normal  Neck:  Supple.  No adenopathy is present.  No thyromegaly is present    Lungs:  Clear to auscultation.  Breath sounds are equal.  Heart:  Regular rate and rhythm without murmurs, rubs, or gallops.  Abdomen:  Nontender without masses or hepatosplenomegaly.  Bowel sounds are present.  No CVA or flank tenderness.   Back:   Can heel/toe walk and squat without difficulty.   Tenderness over the left SI joint.  Straight leg raising test is negative.  Sitting knee extension test is negative.  Strength and sensation in the lower extremities is normal.  Patellar and achilles reflexes are normal.  Extremities:  No edema.  Left hip:  Full range of motion.  There is tenderness just proximal to the greater trochanter extending to buttock.  Resisted lateral abduction of the left hip causes pain.  Left leg is approximately 1cm shorter than the right by inspection. urinalysis (dipstick): normal Assessment New Problems: UNEQUAL LEG LENGTH (ICD-736.81) TROCHANTERIC BURSITIS, LEFT (ICD-726.5) SACROILIAC JOINT DYSFUNCTION (ICD-724.6) HYPOTHYROIDISM (ICD-244.9)  SUSPECT LEFT SI JOINT INFLAMMATION AND LEFT TROCHANTERIC BURSITIS AS A RESULT OF CARRYING HER INFANT ON LEFT SIDE AND LEG LENGTH DISCREPANCY.  Plan New Orders: Urinalysis [CPT-81003] Services provided After hours-Weekends-Holidays [99051] New Patient Level IV [99204] Planning Comments:   Begin NSAID.  Begin applying ice pack to painful areas several times daily.  Obtain shoe insert for left shoe.  Begin stretching exercises ([facility] information and instruction patient handout given)  Followup with [facility] if not improved in two weeks.   The patient and/or caregiver has been counseled thoroughly with regard to medications prescribed including dosage, schedule, interactions, rationale for use, and possible side effects and they verbalize understanding.  Diagnoses and expected course of recovery discussed and will return if not improved as expected or if the condition worsens. Patient and/or caregiver  verbalized understanding.   Orders Added: 1)  Urinalysis [CPT-81003] 2)  Services provided After hours-Weekends-Holidays [99051] 3)  New Patient Level IV [99204]    Laboratory Results   Urine Tests  Date/Time Received: February 03, 2011 11:52 AM  Date/Time Reported: February 03, 2011 11:52 AM   Routine Urinalysis   Color: yellow Appearance: Clear Glucose: negative   (Normal Range: Negative) Bilirubin: negative   (Normal Range: Negative) Ketone: negative   (Normal Range: Negative) Spec. Gravity: 1.015   (Normal Range: 1.003-1.035) Blood: negative   (Normal Range: Negative) pH: 7.0   (Normal Range: 5.0-8.0) Protein: negative   (Normal Range: Negative) Urobilinogen: 0.2   (Normal Range: 0-1) Nitrite: negative   (Normal Range: Negative) Leukocyte Esterace: negative   (Normal Range: Negative)

## 2011-10-02 ENCOUNTER — Other Ambulatory Visit: Payer: Self-pay | Admitting: *Deleted

## 2011-10-02 MED ORDER — METOPROLOL TARTRATE 25 MG PO TABS: 25.0000 mg | ORAL_TABLET | Freq: Two times a day (BID) | ORAL | Status: DC

## 2012-02-03 IMAGING — US US OB FOLLOW-UP
1 series · 14 of 28 positions shown · non-contrast
Comparison: none

OBSTETRICAL ULTRASOUND:
 This ultrasound exam was performed in the [HOSPITAL] Ultrasound Department.  The OB US report was generated in the AS system, and faxed to the ordering physician.  This report is also available in [HOSPITAL]?s AccessANYware and in [REDACTED] PACS.

[Series 1: us ob follow up · 0.22mm/px · 14 of 36 slices shown]
[im 2/36]
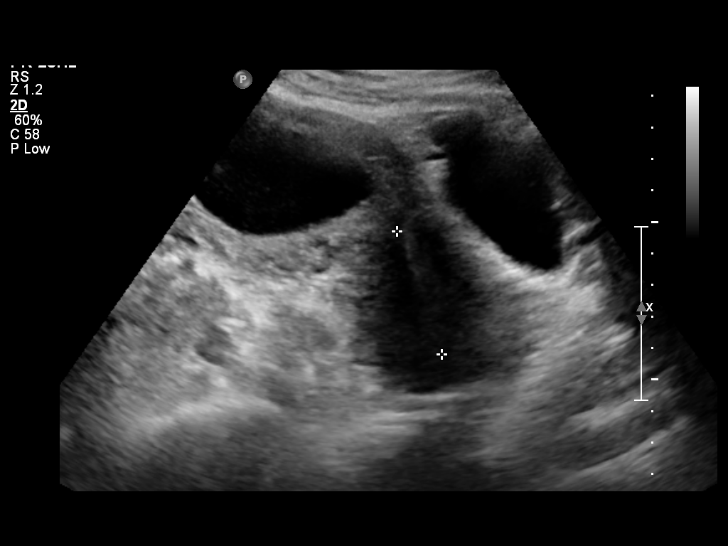
[im 4/36]
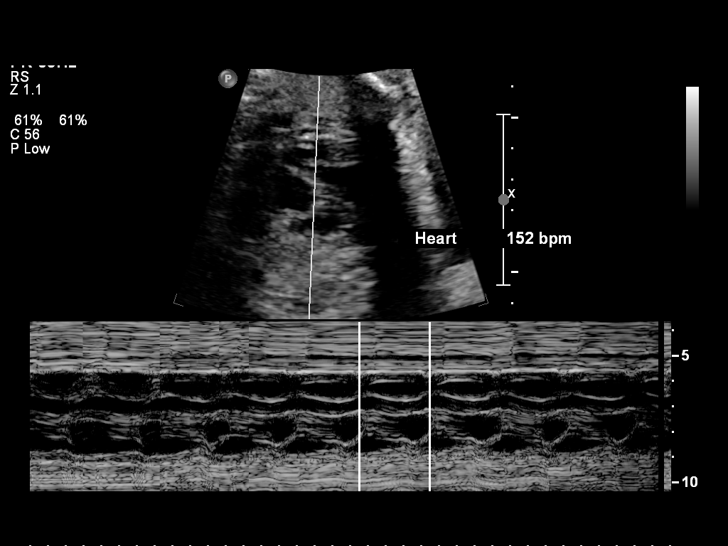
[im 7/36]
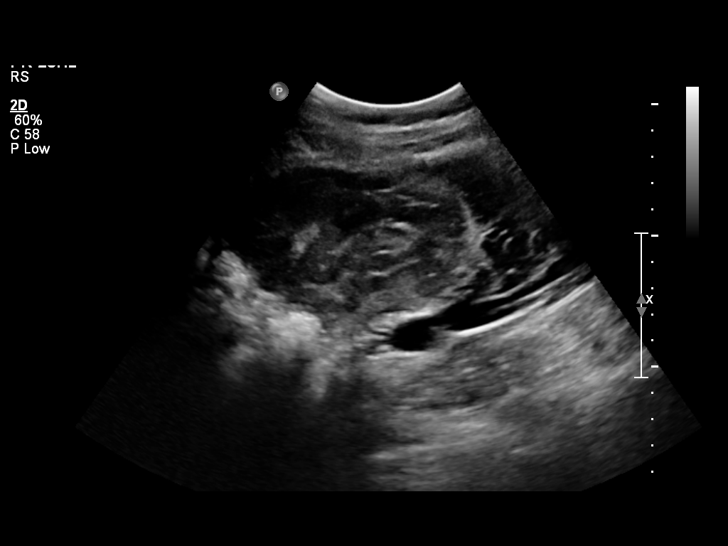
[im 10/36]
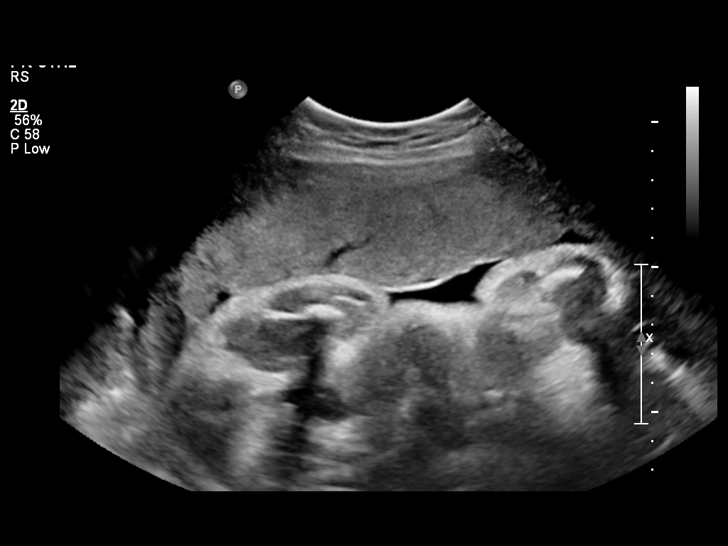
[im 12/36]
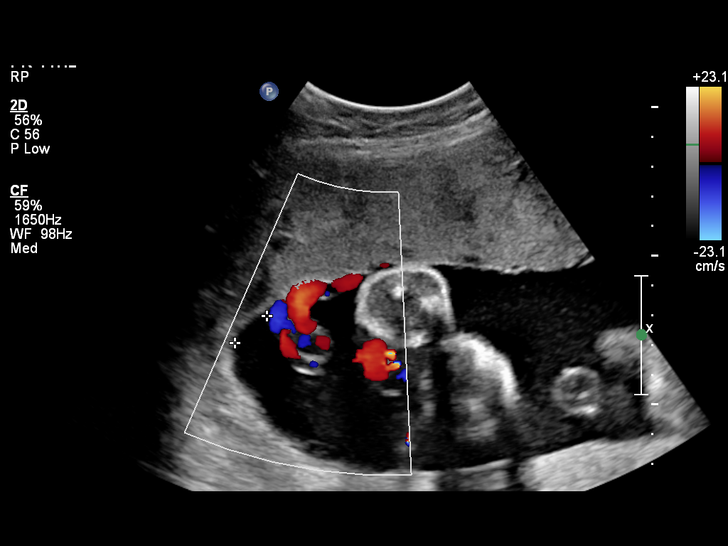
[im 15/36]
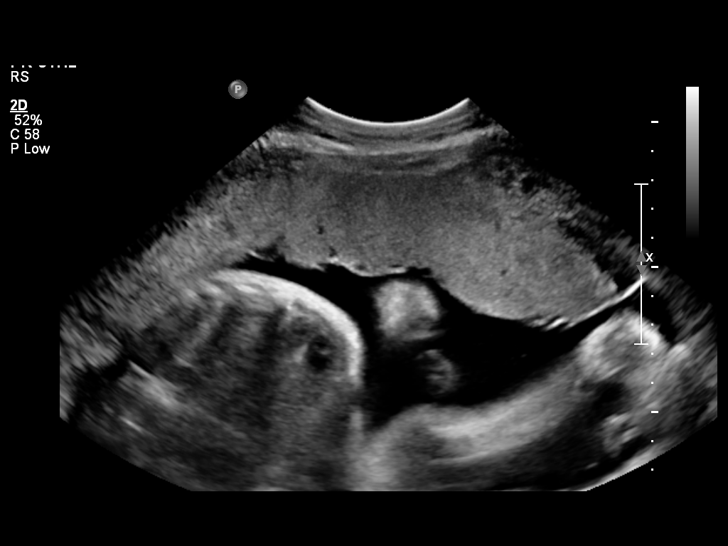
[im 17/36]
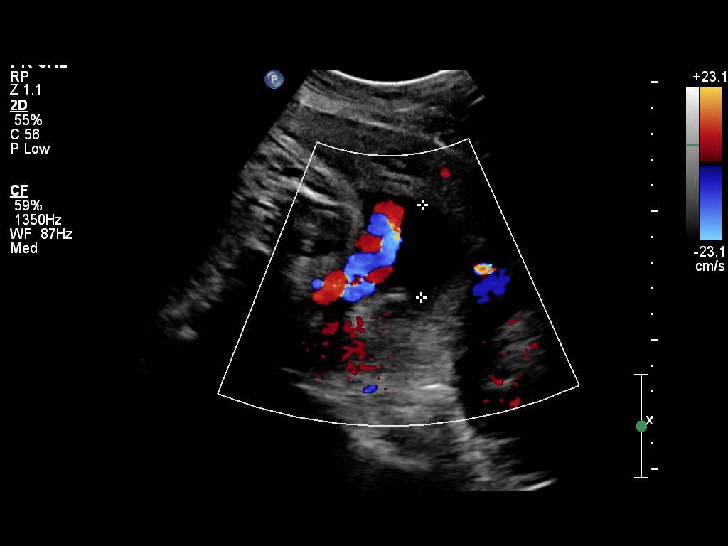
[im 20/36]
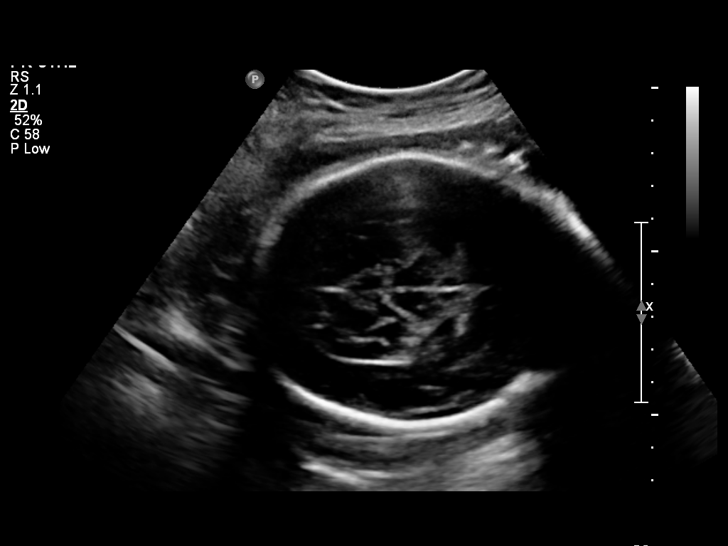
[im 23/36]
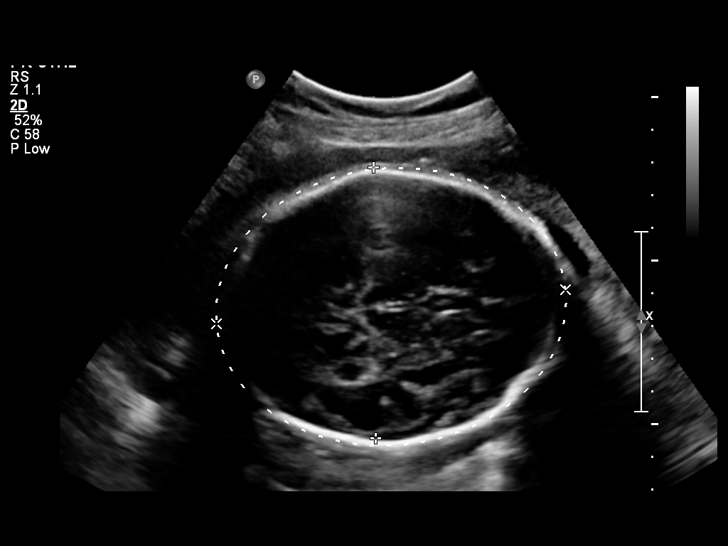
[im 25/36]
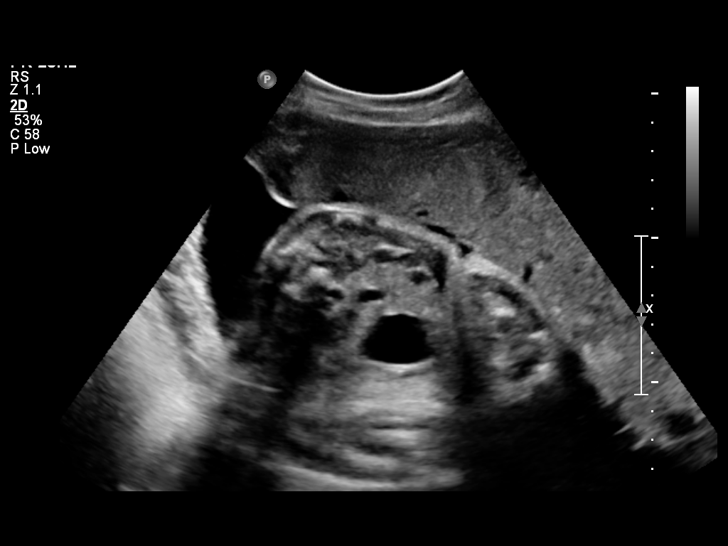
[im 28/36]
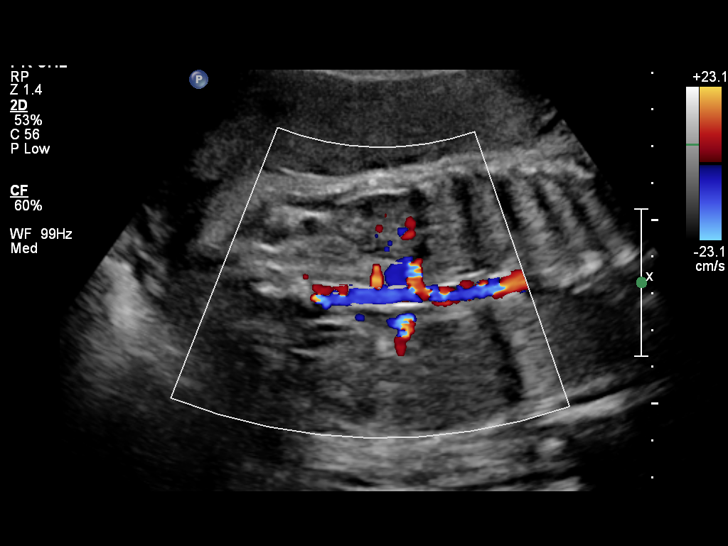
[im 30/36]
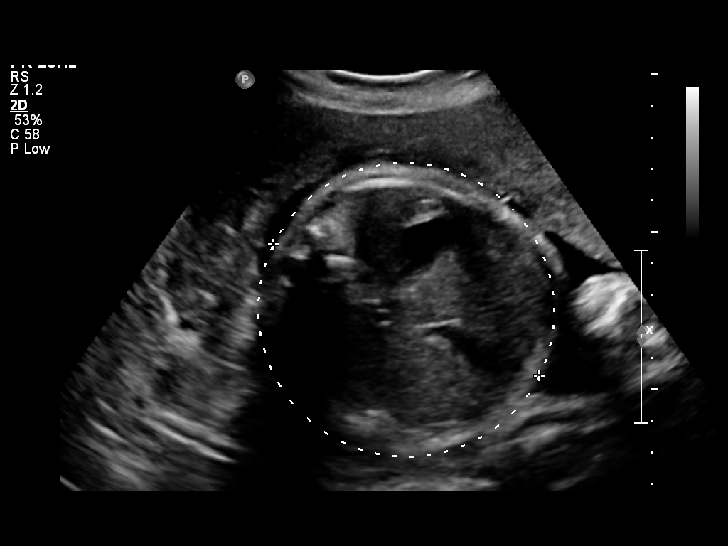
[im 33/36]
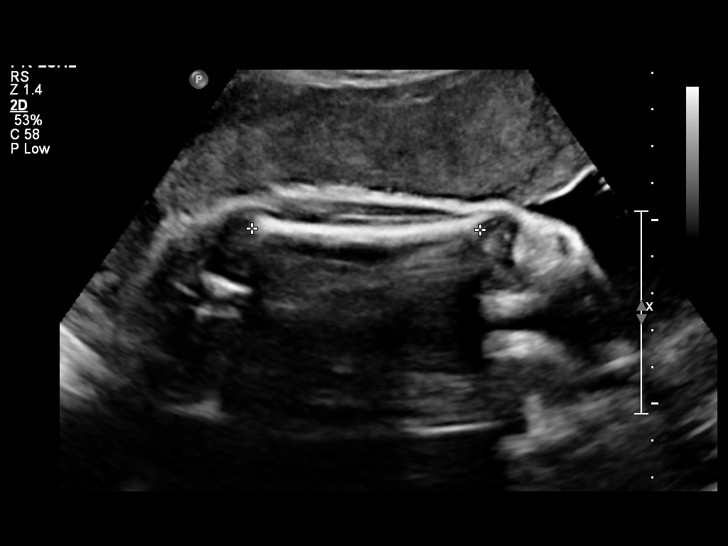
[im 36/36]
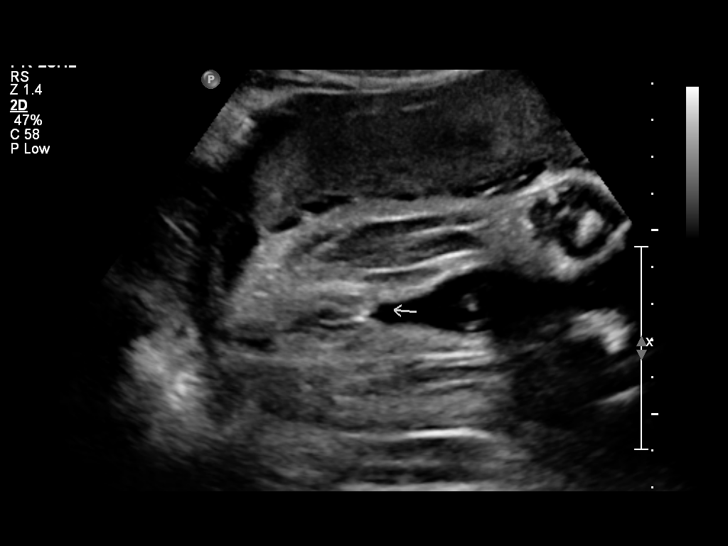

[14 of 28 positions shown; findings below may reference images not displayed]

IMPRESSION: See AS Obstetric US report.

## 2012-02-25 ENCOUNTER — Ambulatory Visit (INDEPENDENT_AMBULATORY_CARE_PROVIDER_SITE_OTHER): Payer: BC Managed Care – PPO | Admitting: Family Medicine

## 2012-02-25 ENCOUNTER — Encounter: Payer: Self-pay | Admitting: Family Medicine

## 2012-02-25 VITALS — BP 113/74 | HR 95 | Ht 67.0 in | Wt 144.0 lb

## 2012-02-25 DIAGNOSIS — I10 Essential (primary) hypertension: Secondary | ICD-10-CM

## 2012-02-25 DIAGNOSIS — Z13220 Encounter for screening for lipoid disorders: Secondary | ICD-10-CM

## 2012-02-25 NOTE — Patient Instructions (Signed)
Stop the metoprolol and recheck BP in one month.

## 2012-02-25 NOTE — Progress Notes (Signed)
  Subjective:    Patient ID: Jade Dixon, female    DOB: Aug 16, 1981, 31 y.o.   MRN: [id number]  HPI She is here to followup on blood pressure today. She's been taking her medication regularly but she is about to run out. No chest pain or short of breath. She sees Dr. Murriel Hopper, endocrinology for her hyperthyroidism. She wonders if she really needs blood pressure pill her blood pressures have been beautiful. She's been told in the past that she didn't need it but was fearful to stop it on her own.   Review of Systems     Objective:   Physical Exam  Constitutional: She is oriented to person, place, and time. She appears well-developed and well-nourished.  HENT:  Head: Normocephalic and atraumatic.  Cardiovascular: Normal rate, regular rhythm and normal heart sounds.   Pulmonary/Chest: Effort normal and breath sounds normal.  Musculoskeletal: She exhibits no edema.  Neurological: She is alert and oriented to person, place, and time.  Skin: Skin is warm and dry.  Psychiatric: She has a normal mood and affect. Her behavior is normal.          Assessment & Plan:  Hypertension-very well controlled. We discussed upping the metoprolol for one month and have her followup for repeat blood pressure check off the medication. If it is well controlled I just encourage her to follow it periodically with her endocrinologist when she goes for her office visits. I really don't think she needs the medication any further. She did have a history of pregnancy-induced hypertension.  She as well as her due for screening cholesterol. Last was 5 years ago and it was slightly elevated at that time. I gave her lab slip today to go sometime in the next month.

## 2012-03-01 ENCOUNTER — Other Ambulatory Visit: Payer: Self-pay | Admitting: Family Medicine

## 2012-04-07 ENCOUNTER — Encounter: Payer: BC Managed Care – PPO | Admitting: Family Medicine

## 2012-04-11 IMAGING — US US FETAL BPP W/O NONSTRESS
1 series · 4 of 4 positions shown · non-contrast
Comparison: none

OBSTETRICAL ULTRASOUND:
 This ultrasound was performed in The [HOSPITAL], and the AS OB/GYN report will be stored to [REDACTED] PACS.  This report is also available in [HOSPITAL]?s accessANYware.

[Series 1: us fetal bpp w/o nonstress · 4 acquisitions, 4 frames shown]
[im 1/4]
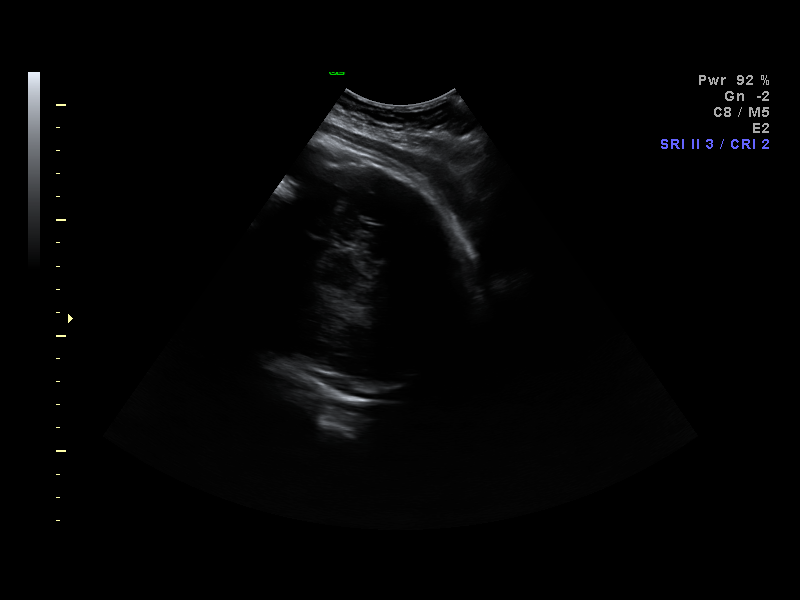
[im 2/4]
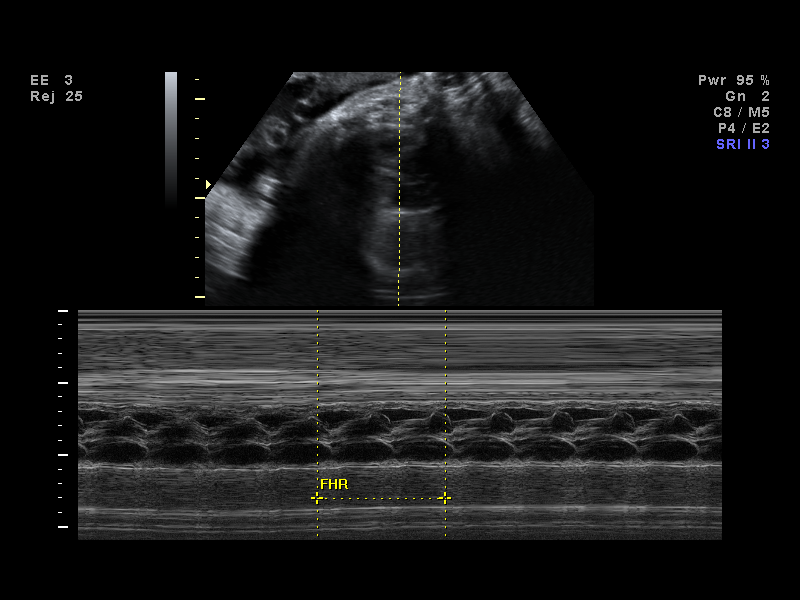
[im 3/4]
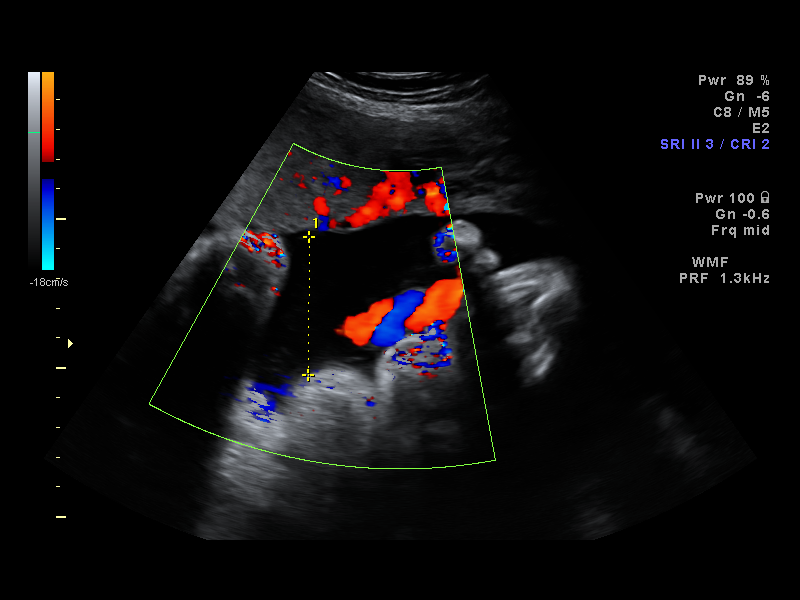
[im 4/4]
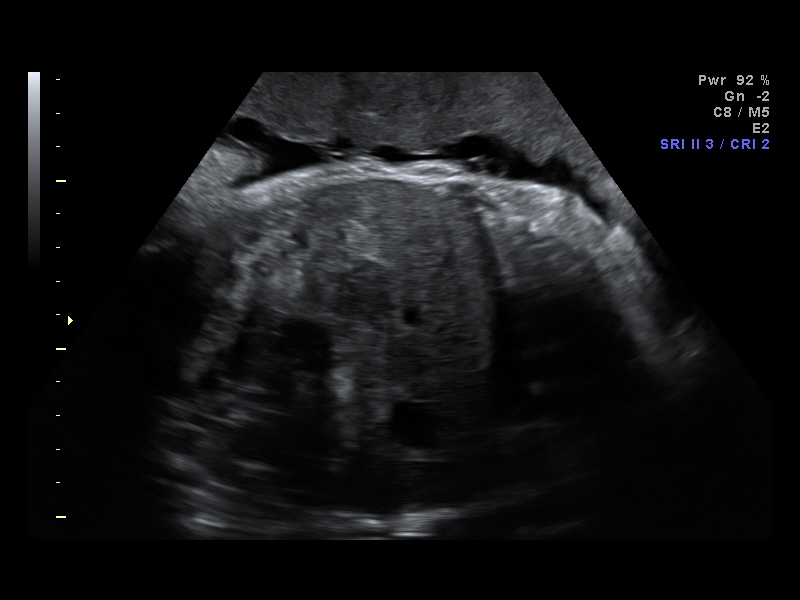

[4 of 4 positions shown; findings below may reference images not displayed]

IMPRESSION: AS OB/GYN has also been faxed to the ordering physician.

## 2012-04-22 LAB — LIPID PANEL
Cholesterol: 155 mg/dL (ref 0–200)
LDL Cholesterol: 96 mg/dL (ref 0–99)
Total CHOL/HDL Ratio: 3.2 Ratio
Triglycerides: 54 mg/dL (ref <150)
VLDL: 11 mg/dL (ref 0–40)

## 2012-09-01 IMAGING — US US PELVIS COMPLETE
1 series · 13 of 25 positions shown · non-contrast
Comparison: None.

CLINICAL DATA: Pelvic pain.  Delivery May 2010 with no menses
since that time.



[Series 1: us pelvis complete · 0.25mm/px · 13 of 46 slices shown]
[im 1/46]
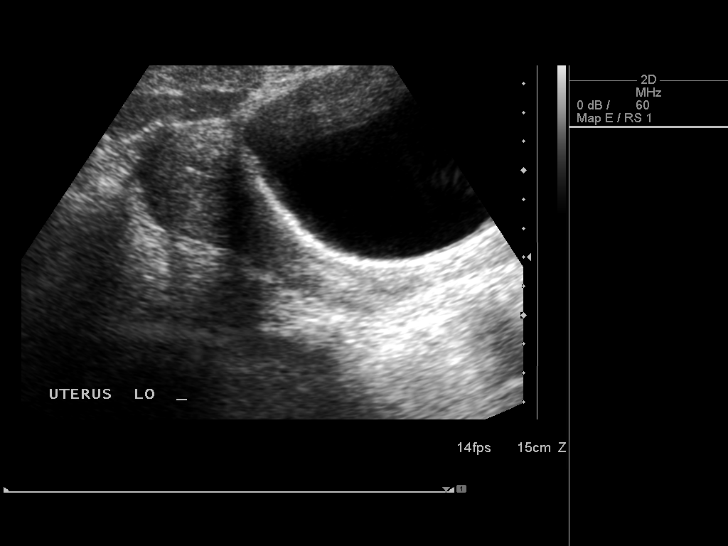
[im 4/46]
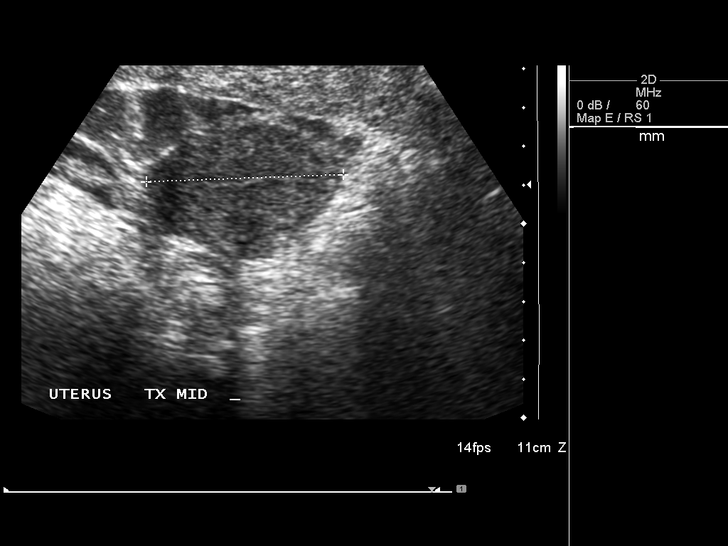
[im 8/46]
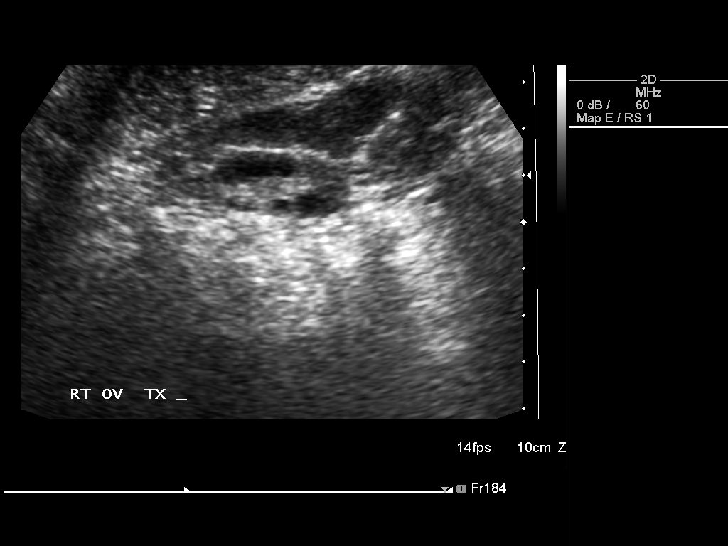
[im 12/46]
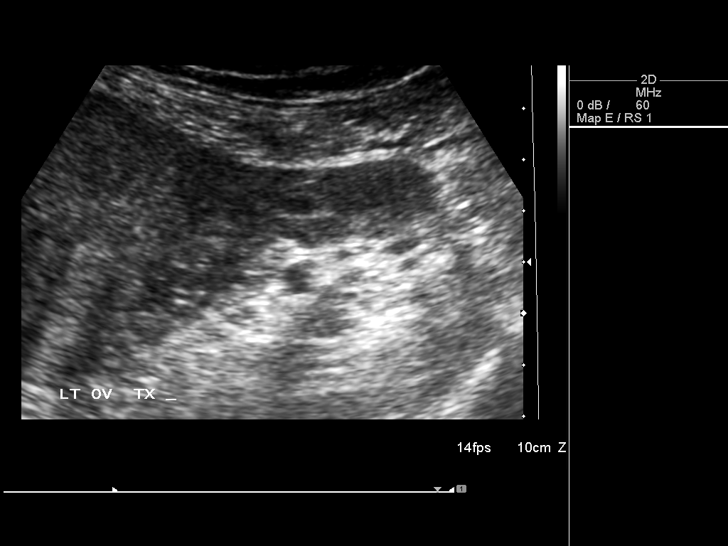
[im 16/46]
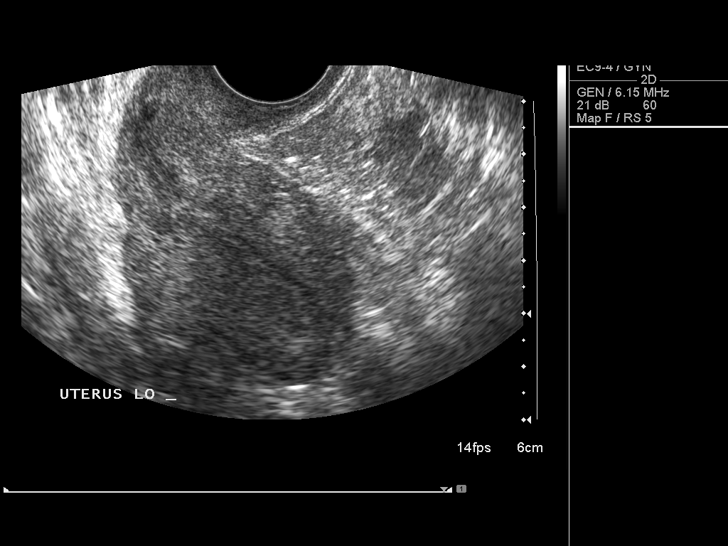
[im 19/46]
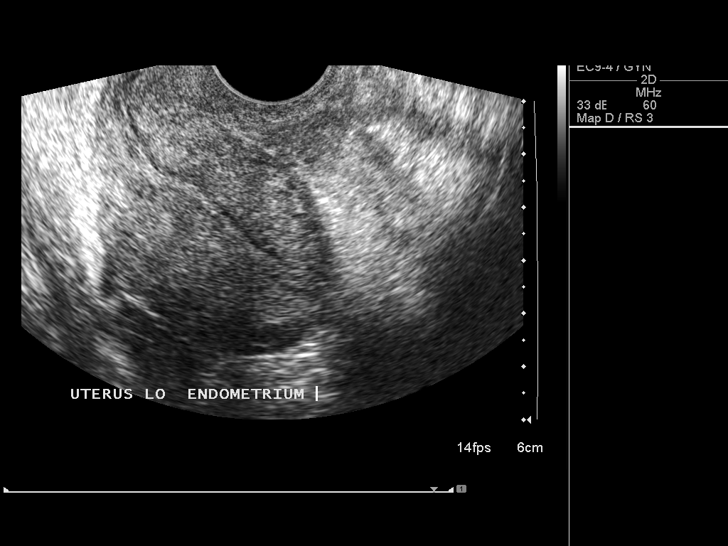
[im 23/46]
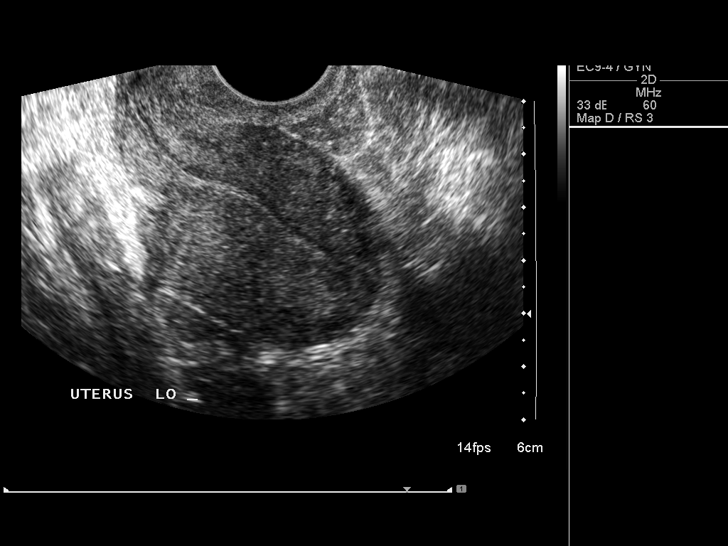
[im 27/46]
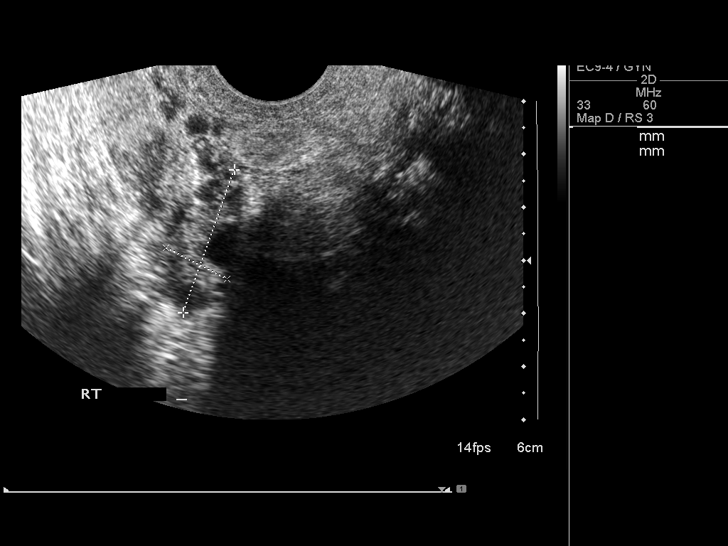
[im 31/46]
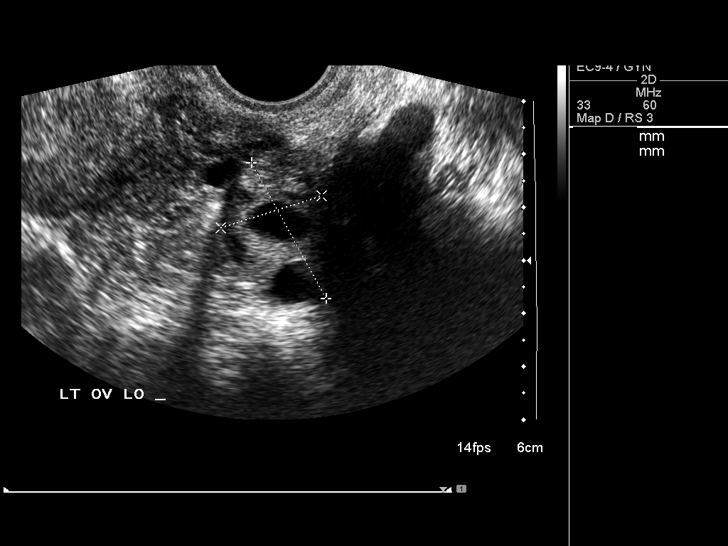
[im 34/46]
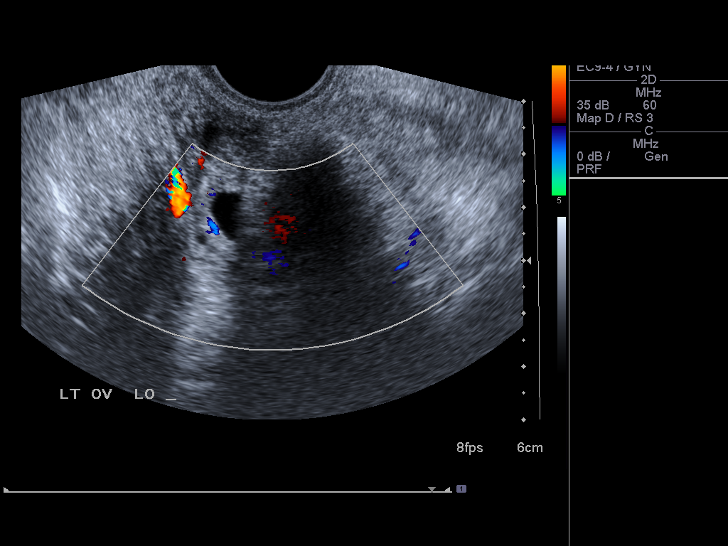
[im 38/46]
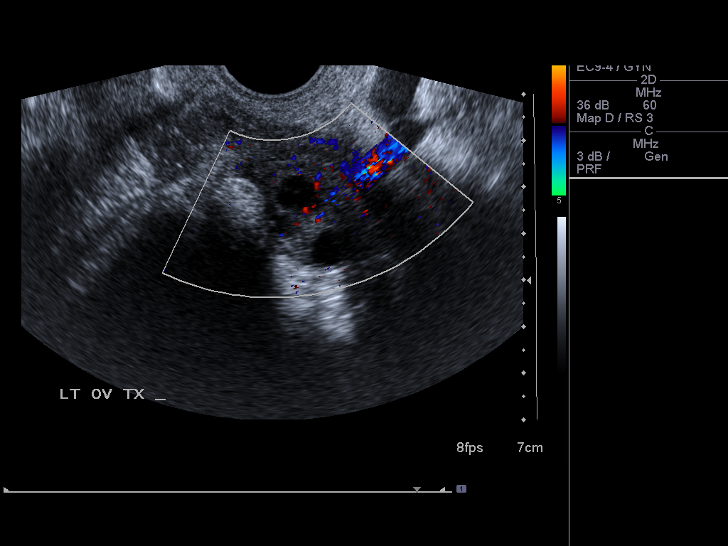
[im 42/46]
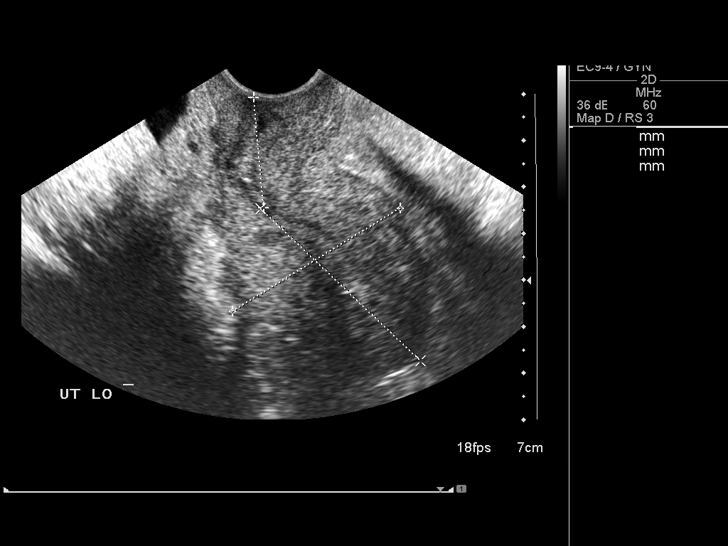
[im 46/46]
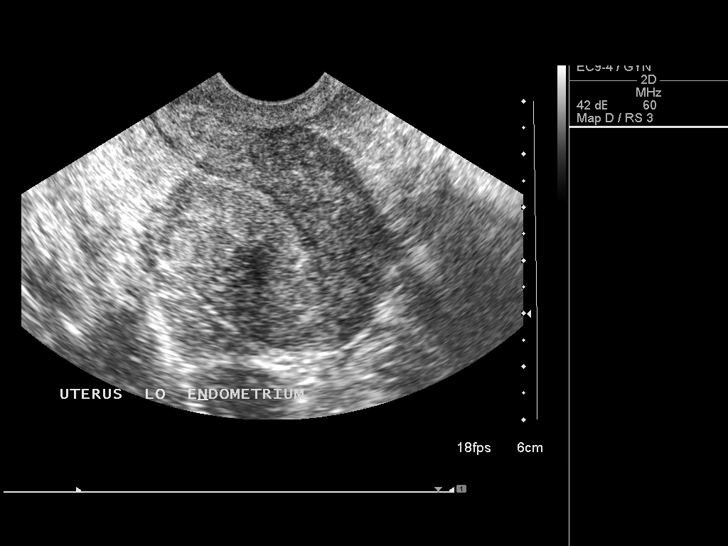

[13 of 25 positions shown; findings below may reference images not displayed]

FINDINGS: Uterus the uterus is retro flexed and demonstrates a sagittal
length of 7.1 cm, an AP depth of 4.2 cm and a transverse width of
5.2 cm.  The myometrium is mildly heterogeneous with no focal
abnormalities identified.

Endometrium is thin and echogenic with an AP width of 3.4 mm.  No
areas of focal thickening or heterogeneity are seen.

Right Ovary has a normal appearance measuring 2.9 x 1.35-0.8 cm

Left Ovary has a normal appearance measuring 2.9 x 2.0 x 1.8 cm

Other Findings:  No pelvic fluid or separate adnexal masses are
seen.
IMPRESSION: Mild uterine heterogeneity with no ancillary findings suggestive of
adenomyosis.  Normal endometrial stripe and ovaries.

## 2013-03-12 ENCOUNTER — Telehealth: Payer: Self-pay | Admitting: *Deleted

## 2013-03-12 NOTE — Telephone Encounter (Signed)
Pt calls and LM on VM wanting to know when her last pap was done here.  Looked in chart patient last pap was 07/06/2010 and looks like done somewhere lese other than here.  Called pt back and LMOM of last recorded pap. Barry Dienes, LPN

## 2018-09-01 ENCOUNTER — Emergency Department
Admission: EM | Admit: 2018-09-01 | Discharge: 2018-09-01 | Disposition: A | Discharge status: Home / Self Care | Payer: BC Managed Care – PPO | Source: Home / Self Care

## 2018-09-01 ENCOUNTER — Other Ambulatory Visit: Payer: Self-pay

## 2018-09-01 DIAGNOSIS — R300 Dysuria: Secondary | ICD-10-CM

## 2018-09-01 DIAGNOSIS — N390 Urinary tract infection, site not specified: Secondary | ICD-10-CM | POA: Diagnosis not present

## 2018-09-01 LAB — POCT URINALYSIS DIP (MANUAL ENTRY)
Bilirubin, UA: NEGATIVE
Glucose, UA: NEGATIVE mg/dL
Ketones, POC UA: NEGATIVE mg/dL
Nitrite, UA: NEGATIVE
Protein Ur, POC: >=300 mg/dL — AB
Spec Grav, UA: 1.025 (ref 1.010–1.025)
Urobilinogen, UA: 0.2 E.U./dL
pH, UA: 6 (ref 5.0–8.0)

## 2018-09-01 MED ORDER — CEPHALEXIN 500 MG PO CAPS: 500.0000 mg | ORAL_CAPSULE | Freq: Three times a day (TID) | ORAL | 0 refills | Status: DC

## 2018-09-01 NOTE — ED Triage Notes (Signed)
Started last night with lower abdominal pain, and back pain.  Burning and frequency today with blood in her urine.

## 2018-09-01 NOTE — ED Provider Notes (Signed)
[facility]    CSN: [id number] Arrival date & time: 09/01/18  1742     History   Chief Complaint No chief complaint on file.   HPI ADDALIE CALLES is a 37 y.o. female.   This is an initial [facility] visit for this 37 yo woman suspected of having UTI because of dysuria which started last night.  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     Past Medical History:  Diagnosis Date  . Hashimoto's disease   . Hypertension   . Hypothyroidism   . IBS (irritable bowel syndrome)     Patient Active Problem List   Diagnosis Date Noted  . Hashimoto's thyroiditis 05/28/2011  . Prolonged Q-T interval on ECG 05/28/2011  . HYPOTHYROIDISM 02/03/2011  . SACROILIAC JOINT DYSFUNCTION 02/03/2011  . TROCHANTERIC BURSITIS, LEFT 02/03/2011  . UNEQUAL LEG LENGTH 02/03/2011  . Anxiety 01/14/2011  . PREGNANCY-INDUCED HYPERTENSION 03/13/2010  . GERD 08/10/2007  . IBS 08/10/2007  . ALLERGIC RHINITIS 01/07/2007    Past Surgical History:  Procedure Laterality Date  . CESAREAN SECTION  2009, 2012    OB History   None      Home Medications    Prior to Admission medications   Medication Sig Start Date End Date Taking? Authorizing Provider  cephALEXin (KEFLEX) 500 MG capsule Take 1 capsule (500 mg total) by mouth 3 (three) times daily. 09/01/18   Elvina Sidle, MD  levothyroxine (SYNTHROID, LEVOTHROID) 75 MCG tablet Take 75 mcg by mouth daily.    [provider]  metoprolol tartrate (LOPRESSOR) 25 MG tablet TAKE ONE TABLET BY MOUTH TWICE DAILY 03/01/12   Agapito Games, MD    Family History Family History  Problem Relation Age of Onset  . Diabetes Father        type 2  . Hyperlipidemia Father   . Hypertension Father   . Thyroid disease Mother   . Thyroid disease Maternal Grandmother   . Thyroid cancer Mother     Social History Social History   Tobacco Use  . Smoking status: Never Smoker   Substance Use Topics  . Alcohol use: Yes    Comment: Occasional  . Drug use: Not on file     Allergies   Patient has no known allergies.   Review of Systems Review of Systems  Genitourinary: Positive for dysuria.  Musculoskeletal: Positive for back pain.     Physical Exam Triage Vital Signs ED Triage Vitals  Enc Vitals Group     BP      Pulse      Resp      Temp      Temp src      SpO2      Weight      Height      Head Circumference      Peak Flow      Pain Score      Pain Loc      Pain Edu?      Excl. in GC?    No data found.  Updated Vital Signs There were no vitals taken for this visit.   Physical Exam  Constitutional: She is oriented to person, place, and time. She appears well-developed and well-nourished.  HENT:  Right Ear: External ear normal.  Left Ear: External ear normal.  Eyes: Conjunctivae are normal.  Neck: Normal range of motion.  Pulmonary/Chest: Effort normal.  Musculoskeletal: Normal range  of motion.  Neurological: She is alert and oriented to person, place, and time.  Skin: Skin is warm and dry.  Nursing note and vitals reviewed.    UC Treatments / Results  Labs (all labs ordered are listed, but only abnormal results are displayed) Labs Reviewed  POCT URINALYSIS DIP (MANUAL ENTRY) - Abnormal; Notable for the following components:      Result Value   Clarity, UA cloudy (*)    Blood, UA moderate (*)    Protein Ur, POC >=300 (*)    Leukocytes, UA Moderate (2+) (*)    All other components within normal limits  URINE CULTURE    EKG None  Radiology No results found.  Procedures Procedures (including critical care time)  Medications Ordered in UC Medications - No data to display  Initial Impression / Assessment and Plan / UC Course  I have reviewed the triage vital signs and the nursing notes.  Pertinent labs & imaging results that were available during my care of the patient were reviewed by me and considered in my  medical decision making (see chart for details).    Final Clinical Impressions(s) / UC Diagnoses   Final diagnoses:  Dysuria  Urinary tract infection without hematuria, site unspecified   Discharge Instructions   None    ED Prescriptions    Medication Sig Dispense Auth. Provider   cephALEXin (KEFLEX) 500 MG capsule Take 1 capsule (500 mg total) by mouth 3 (three) times daily. 20 capsule Elvina SidleLauenstein, Breena Bevacqua, MD     Controlled Substance Prescriptions [place] Controlled Substance Registry consulted? Not Applicable   Elvina SidleLauenstein, Ladaysha Soutar, MD 09/01/18 1810

## 2018-09-03 LAB — URINE CULTURE
MICRO NUMBER:: 91424944
SPECIMEN QUALITY:: ADEQUATE

## 2018-09-04 ENCOUNTER — Telehealth: Payer: Self-pay | Admitting: *Deleted

## 2018-09-04 ENCOUNTER — Telehealth: Payer: Self-pay | Admitting: Emergency Medicine

## 2018-09-04 MED ORDER — CIPROFLOXACIN HCL 250 MG PO TABS: 250.0000 mg | ORAL_TABLET | Freq: Two times a day (BID) | ORAL | 0 refills | Status: AC

## 2018-09-04 NOTE — Telephone Encounter (Signed)
Reviewed urine culture, will change pt to Cipro. Have her discontinue taking the cephalexin. F/u with PCP as needed.

## 2018-09-04 NOTE — Telephone Encounter (Signed)
Changed Cipro to Macrobid I po BID x 5 days per Waylan RocherErin Phelps, PA  after call from pharmacy about possible interaction.

## 2018-09-04 NOTE — Telephone Encounter (Signed)
Spoke to pt given Ucx results. Advised her Cipro is being sent to her pharmacy.stop keflex and begin cipro. Pt verbalized understanding.
